# Patient Record
Sex: Male | Born: 1955 | Race: White | Hispanic: No | Marital: Single | Smoking: Former smoker
Health system: Southern US, Community
[De-identification: ages and names within clinical notes are randomized; demographics above are authoritative.]

## PROBLEM LIST (undated history)

## (undated) DIAGNOSIS — T7840XA Allergy, unspecified, initial encounter: Secondary | ICD-10-CM

## (undated) DIAGNOSIS — K219 Gastro-esophageal reflux disease without esophagitis: Secondary | ICD-10-CM

## (undated) DIAGNOSIS — S83419A Sprain of medial collateral ligament of unspecified knee, initial encounter: Secondary | ICD-10-CM

## (undated) DIAGNOSIS — K5730 Diverticulosis of large intestine without perforation or abscess without bleeding: Secondary | ICD-10-CM

## (undated) DIAGNOSIS — M1990 Unspecified osteoarthritis, unspecified site: Secondary | ICD-10-CM

## (undated) DIAGNOSIS — N419 Inflammatory disease of prostate, unspecified: Secondary | ICD-10-CM

## (undated) DIAGNOSIS — C4490 Unspecified malignant neoplasm of skin, unspecified: Secondary | ICD-10-CM

## (undated) DIAGNOSIS — I2510 Atherosclerotic heart disease of native coronary artery without angina pectoris: Secondary | ICD-10-CM

## (undated) DIAGNOSIS — S73199A Other sprain of unspecified hip, initial encounter: Secondary | ICD-10-CM

## (undated) DIAGNOSIS — E785 Hyperlipidemia, unspecified: Secondary | ICD-10-CM

## (undated) HISTORY — DX: Allergy, unspecified, initial encounter: T78.40XA

## (undated) HISTORY — PX: UPPER GASTROINTESTINAL ENDOSCOPY: SHX188

## (undated) HISTORY — DX: Unspecified malignant neoplasm of skin, unspecified: C44.90

## (undated) HISTORY — DX: Diverticulosis of large intestine without perforation or abscess without bleeding: K57.30

## (undated) HISTORY — DX: Hyperlipidemia, unspecified: E78.5

## (undated) HISTORY — DX: Unspecified osteoarthritis, unspecified site: M19.90

## (undated) HISTORY — PX: COLONOSCOPY: SHX174

## (undated) HISTORY — PX: KNEE ARTHROSCOPY WITH ANTERIOR CRUCIATE LIGAMENT (ACL) REPAIR: SHX5644

## (undated) HISTORY — DX: Inflammatory disease of prostate, unspecified: N41.9

## (undated) HISTORY — DX: Sprain of medial collateral ligament of unspecified knee, initial encounter: S83.419A

## (undated) HISTORY — DX: Other sprain of unspecified hip, initial encounter: S73.199A

## (undated) HISTORY — DX: Gastro-esophageal reflux disease without esophagitis: K21.9

---

## 2004-02-19 ENCOUNTER — Ambulatory Visit: Payer: Self-pay | Admitting: Gastroenterology

## 2005-03-15 ENCOUNTER — Ambulatory Visit: Payer: Self-pay | Admitting: Internal Medicine

## 2006-01-27 ENCOUNTER — Ambulatory Visit: Payer: Self-pay | Admitting: Internal Medicine

## 2006-02-21 ENCOUNTER — Ambulatory Visit: Payer: Self-pay | Admitting: Internal Medicine

## 2006-05-16 ENCOUNTER — Ambulatory Visit: Payer: Self-pay | Admitting: Internal Medicine

## 2006-05-16 LAB — CONVERTED CEMR LAB
ALT: 44 units/L — ABNORMAL HIGH (ref 0–40)
AST: 33 units/L (ref 0–37)
Cholesterol: 98 mg/dL (ref 0–200)
HDL: 47.8 mg/dL (ref >39.0)
LDL Cholesterol: 39 mg/dL (ref 0–99)
Total CHOL/HDL Ratio: 2.1
Triglycerides: 56 mg/dL (ref 0–149)
VLDL: 11 mg/dL (ref 0–40)

## 2006-08-24 ENCOUNTER — Ambulatory Visit: Payer: Self-pay | Admitting: Internal Medicine

## 2006-08-24 DIAGNOSIS — J309 Allergic rhinitis, unspecified: Secondary | ICD-10-CM | POA: Insufficient documentation

## 2006-08-24 LAB — CONVERTED CEMR LAB
ALT: 35 units/L (ref 0–40)
AST: 29 units/L (ref 0–37)
Cholesterol: 104 mg/dL (ref 0–200)
HDL: 42 mg/dL (ref >39.0)
LDL Cholesterol: 49 mg/dL (ref 0–99)
Total CHOL/HDL Ratio: 2.5
Triglycerides: 66 mg/dL (ref 0–149)
VLDL: 13 mg/dL (ref 0–40)

## 2006-08-28 ENCOUNTER — Ambulatory Visit: Payer: Self-pay | Admitting: Internal Medicine

## 2007-02-05 ENCOUNTER — Ambulatory Visit: Payer: Self-pay | Admitting: Internal Medicine

## 2007-02-05 DIAGNOSIS — Z85828 Personal history of other malignant neoplasm of skin: Secondary | ICD-10-CM | POA: Insufficient documentation

## 2007-02-05 DIAGNOSIS — E782 Mixed hyperlipidemia: Secondary | ICD-10-CM | POA: Insufficient documentation

## 2007-02-05 LAB — CONVERTED CEMR LAB
Cholesterol, target level: 200 mg/dL
HDL goal, serum: 40 mg/dL
LDL Goal: 160 mg/dL

## 2007-02-12 ENCOUNTER — Ambulatory Visit: Payer: Self-pay | Admitting: Internal Medicine

## 2007-02-13 ENCOUNTER — Encounter (INDEPENDENT_AMBULATORY_CARE_PROVIDER_SITE_OTHER): Payer: Self-pay | Admitting: *Deleted

## 2007-02-15 ENCOUNTER — Encounter (INDEPENDENT_AMBULATORY_CARE_PROVIDER_SITE_OTHER): Payer: Self-pay | Admitting: *Deleted

## 2007-02-15 LAB — CONVERTED CEMR LAB
ALT: 24 units/L (ref 0–53)
AST: 33 units/L (ref 0–37)
Albumin: 4.2 g/dL (ref 3.5–5.2)
Alkaline Phosphatase: 63 units/L (ref 39–117)
BUN: 12 mg/dL (ref 6–23)
Basophils Absolute: 0 10*3/uL (ref 0.0–0.1)
Basophils Relative: 0.5 % (ref 0.0–1.0)
Bilirubin, Direct: 0.1 mg/dL (ref 0.0–0.3)
CO2: 27 meq/L (ref 19–32)
Calcium: 9.6 mg/dL (ref 8.4–10.5)
Chloride: 102 meq/L (ref 96–112)
Cholesterol: 197 mg/dL (ref 0–200)
Creatinine, Ser: 1 mg/dL (ref 0.4–1.5)
Eosinophils Absolute: 0.1 10*3/uL (ref 0.0–0.6)
Eosinophils Relative: 1.8 % (ref 0.0–5.0)
GFR calc Af Amer: 102 mL/min
GFR calc non Af Amer: 84 mL/min
Glucose, Bld: 96 mg/dL (ref 70–99)
HCT: 45.2 % (ref 39.0–52.0)
HDL: 46.7 mg/dL (ref >39.0)
Hemoglobin: 15.9 g/dL (ref 13.0–17.0)
LDL Cholesterol: 122 mg/dL — ABNORMAL HIGH (ref 0–99)
Lymphocytes Relative: 28.9 % (ref 12.0–46.0)
MCHC: 35.1 g/dL (ref 30.0–36.0)
MCV: 93.9 fL (ref 78.0–100.0)
Monocytes Absolute: 0.5 10*3/uL (ref 0.2–0.7)
Monocytes Relative: 8 % (ref 3.0–11.0)
Neutro Abs: 3.6 10*3/uL (ref 1.4–7.7)
Neutrophils Relative %: 60.8 % (ref 43.0–77.0)
Platelets: 256 10*3/uL (ref 150–400)
Potassium: 4.3 meq/L (ref 3.5–5.1)
RBC: 4.82 M/uL (ref 4.22–5.81)
RDW: 11.5 % (ref 11.5–14.6)
Sodium: 138 meq/L (ref 135–145)
TSH: 1.92 microintl units/mL (ref 0.35–5.50)
Total Bilirubin: 1 mg/dL (ref 0.3–1.2)
Total CHOL/HDL Ratio: 4.2
Total Protein: 7.4 g/dL (ref 6.0–8.3)
Triglycerides: 143 mg/dL (ref 0–149)
VLDL: 29 mg/dL (ref 0–40)
WBC: 5.9 10*3/uL (ref 4.5–10.5)

## 2007-09-27 ENCOUNTER — Ambulatory Visit: Payer: Self-pay | Admitting: Internal Medicine

## 2007-09-27 LAB — CONVERTED CEMR LAB
Cholesterol, target level: 200 mg/dL
HDL goal, serum: 40 mg/dL
LDL Goal: 120 mg/dL

## 2007-10-02 ENCOUNTER — Ambulatory Visit: Payer: Self-pay | Admitting: Internal Medicine

## 2007-10-12 ENCOUNTER — Encounter (INDEPENDENT_AMBULATORY_CARE_PROVIDER_SITE_OTHER): Payer: Self-pay | Admitting: *Deleted

## 2007-10-17 ENCOUNTER — Ambulatory Visit: Payer: Self-pay | Admitting: Internal Medicine

## 2007-10-17 LAB — CONVERTED CEMR LAB
Cholesterol, target level: 200 mg/dL
HDL goal, serum: 40 mg/dL
LDL Goal: 90 mg/dL

## 2008-02-26 ENCOUNTER — Ambulatory Visit: Payer: Self-pay | Admitting: Internal Medicine

## 2008-02-26 DIAGNOSIS — K5730 Diverticulosis of large intestine without perforation or abscess without bleeding: Secondary | ICD-10-CM | POA: Insufficient documentation

## 2008-02-26 LAB — CONVERTED CEMR LAB
ALT: 49 units/L (ref 0–53)
AST: 34 units/L (ref 0–37)
Albumin: 4.2 g/dL (ref 3.5–5.2)
Alkaline Phosphatase: 52 units/L (ref 39–117)
BUN: 19 mg/dL (ref 6–23)
Basophils Absolute: 0 10*3/uL (ref 0.0–0.1)
Basophils Relative: 1 % (ref 0.0–3.0)
Bilirubin Urine: NEGATIVE
Bilirubin, Direct: 0.1 mg/dL (ref 0.0–0.3)
CO2: 29 meq/L (ref 19–32)
Calcium: 9.6 mg/dL (ref 8.4–10.5)
Chloride: 105 meq/L (ref 96–112)
Cholesterol, target level: 200 mg/dL
Cholesterol: 139 mg/dL (ref 0–200)
Creatinine, Ser: 0.9 mg/dL (ref 0.4–1.5)
Eosinophils Absolute: 0.1 10*3/uL (ref 0.0–0.7)
Eosinophils Relative: 1.4 % (ref 0.0–5.0)
GFR calc Af Amer: 115 mL/min
GFR calc non Af Amer: 95 mL/min
Glucose, Bld: 95 mg/dL (ref 70–99)
HCT: 42.9 % (ref 39.0–52.0)
HDL goal, serum: 40 mg/dL
HDL: 50.9 mg/dL (ref >39.0)
Hemoglobin: 15 g/dL (ref 13.0–17.0)
Ketones, ur: NEGATIVE mg/dL
LDL Cholesterol: 78 mg/dL (ref 0–99)
LDL Goal: 120 mg/dL
Leukocytes, UA: NEGATIVE
Lymphocytes Relative: 29 % (ref 12.0–46.0)
MCHC: 35 g/dL (ref 30.0–36.0)
MCV: 93.3 fL (ref 78.0–100.0)
Monocytes Absolute: 0.5 10*3/uL (ref 0.1–1.0)
Monocytes Relative: 9.4 % (ref 3.0–12.0)
Neutro Abs: 2.9 10*3/uL (ref 1.4–7.7)
Neutrophils Relative %: 59.2 % (ref 43.0–77.0)
Nitrite: NEGATIVE
PSA: 0.64 ng/mL (ref 0.10–4.00)
Platelets: 164 10*3/uL (ref 150–400)
Potassium: 4.6 meq/L (ref 3.5–5.1)
RBC: 4.6 M/uL (ref 4.22–5.81)
RDW: 11.3 % — ABNORMAL LOW (ref 11.5–14.6)
Sodium: 141 meq/L (ref 135–145)
Specific Gravity, Urine: 1.025 (ref 1.000–1.03)
TSH: 1.69 microintl units/mL (ref 0.35–5.50)
Total Bilirubin: 0.9 mg/dL (ref 0.3–1.2)
Total CHOL/HDL Ratio: 2.7
Total Protein, Urine: NEGATIVE mg/dL
Total Protein: 7.2 g/dL (ref 6.0–8.3)
Triglycerides: 50 mg/dL (ref 0–149)
Urine Glucose: NEGATIVE mg/dL
Urobilinogen, UA: 0.2 (ref 0.0–1.0)
VLDL: 10 mg/dL (ref 0–40)
WBC: 4.9 10*3/uL (ref 4.5–10.5)
pH: 5.5 (ref 5.0–8.0)

## 2008-03-28 ENCOUNTER — Encounter: Payer: Self-pay | Admitting: Internal Medicine

## 2008-04-21 ENCOUNTER — Ambulatory Visit: Payer: Self-pay | Admitting: Family Medicine

## 2008-05-02 ENCOUNTER — Telehealth (INDEPENDENT_AMBULATORY_CARE_PROVIDER_SITE_OTHER): Payer: Self-pay | Admitting: *Deleted

## 2008-07-10 ENCOUNTER — Ambulatory Visit: Payer: Self-pay | Admitting: Family Medicine

## 2009-03-02 ENCOUNTER — Ambulatory Visit: Payer: Self-pay | Admitting: Internal Medicine

## 2009-03-02 LAB — CONVERTED CEMR LAB
AST: 33 units/L (ref 0–37)
Alkaline Phosphatase: 53 units/L (ref 39–117)
Basophils Absolute: 0 10*3/uL (ref 0.0–0.1)
Basophils Relative: 0.4 % (ref 0.0–3.0)
Bilirubin Urine: NEGATIVE
Bilirubin, Direct: 0.1 mg/dL (ref 0.0–0.3)
CO2: 27 meq/L (ref 19–32)
Calcium: 9 mg/dL (ref 8.4–10.5)
Chloride: 105 meq/L (ref 96–112)
Creatinine, Ser: 0.8 mg/dL (ref 0.4–1.5)
Eosinophils Absolute: 0.1 10*3/uL (ref 0.0–0.7)
Glucose, Bld: 99 mg/dL (ref 70–99)
Hemoglobin, Urine: NEGATIVE
Ketones, ur: NEGATIVE mg/dL
LDL Cholesterol: 98 mg/dL (ref 0–99)
LDL Goal: 90 mg/dL
Lymphocytes Relative: 30.5 % (ref 12.0–46.0)
MCHC: 34.3 g/dL (ref 30.0–36.0)
MCV: 95.9 fL (ref 78.0–100.0)
Monocytes Absolute: 0.4 10*3/uL (ref 0.1–1.0)
Neutro Abs: 3.1 10*3/uL (ref 1.4–7.7)
Neutrophils Relative %: 58.5 % (ref 43.0–77.0)
RBC: 4.64 M/uL (ref 4.22–5.81)
RDW: 11.2 % — ABNORMAL LOW (ref 11.5–14.6)
Total Bilirubin: 1.1 mg/dL (ref 0.3–1.2)
Total CHOL/HDL Ratio: 3
Total Protein, Urine: NEGATIVE mg/dL
pH: 5 (ref 5.0–8.0)

## 2009-03-09 ENCOUNTER — Ambulatory Visit: Payer: Self-pay | Admitting: Internal Medicine

## 2009-03-09 LAB — CONVERTED CEMR LAB: OCCULT 3: NEGATIVE

## 2009-03-10 ENCOUNTER — Encounter (INDEPENDENT_AMBULATORY_CARE_PROVIDER_SITE_OTHER): Payer: Self-pay | Admitting: *Deleted

## 2009-06-22 ENCOUNTER — Ambulatory Visit: Payer: Self-pay | Admitting: Internal Medicine

## 2009-06-22 DIAGNOSIS — R209 Unspecified disturbances of skin sensation: Secondary | ICD-10-CM | POA: Insufficient documentation

## 2010-01-13 ENCOUNTER — Telehealth (INDEPENDENT_AMBULATORY_CARE_PROVIDER_SITE_OTHER): Payer: Self-pay | Admitting: *Deleted

## 2010-03-08 ENCOUNTER — Ambulatory Visit: Payer: Self-pay | Admitting: Internal Medicine

## 2010-03-16 LAB — CONVERTED CEMR LAB
ALT: 29 units/L (ref 0–53)
AST: 55 units/L — ABNORMAL HIGH (ref 0–37)
Alkaline Phosphatase: 55 units/L (ref 39–117)
BUN: 20 mg/dL (ref 6–23)
Basophils Absolute: 0 10*3/uL (ref 0.0–0.1)
Bilirubin Urine: NEGATIVE
Bilirubin, Direct: 0.1 mg/dL (ref 0.0–0.3)
CO2: 27 meq/L (ref 19–32)
Calcium: 9.2 mg/dL (ref 8.4–10.5)
Creatinine, Ser: 1 mg/dL (ref 0.4–1.5)
Eosinophils Absolute: 0.2 10*3/uL (ref 0.0–0.7)
GFR calc non Af Amer: 83.14 mL/min (ref >60)
Glucose, Bld: 98 mg/dL (ref 70–99)
Hemoglobin: 14.3 g/dL (ref 13.0–17.0)
Lymphocytes Relative: 35.1 % (ref 12.0–46.0)
MCHC: 35.3 g/dL (ref 30.0–36.0)
Neutro Abs: 2.4 10*3/uL (ref 1.4–7.7)
Neutrophils Relative %: 52.3 % (ref 43.0–77.0)
Nitrite: NEGATIVE
Platelets: 196 10*3/uL (ref 150.0–400.0)
RDW: 12.5 % (ref 11.5–14.6)
TSH: 1.38 microintl units/mL (ref 0.35–5.50)
Total Bilirubin: 0.8 mg/dL (ref 0.3–1.2)
Total Protein, Urine: NEGATIVE mg/dL
Urobilinogen, UA: 0.2 (ref 0.0–1.0)

## 2010-03-17 ENCOUNTER — Encounter: Payer: Self-pay | Admitting: Internal Medicine

## 2010-03-17 ENCOUNTER — Ambulatory Visit: Payer: Self-pay | Admitting: Internal Medicine

## 2010-03-17 DIAGNOSIS — R7401 Elevation of levels of liver transaminase levels: Secondary | ICD-10-CM | POA: Insufficient documentation

## 2010-03-17 DIAGNOSIS — R740 Nonspecific elevation of levels of transaminase and lactic acid dehydrogenase [LDH]: Secondary | ICD-10-CM

## 2010-03-18 LAB — CONVERTED CEMR LAB
HDL: 56.1 mg/dL (ref >39.00)
Total CHOL/HDL Ratio: 3
VLDL: 18.2 mg/dL (ref 0.0–40.0)

## 2010-04-27 ENCOUNTER — Ambulatory Visit
Admission: RE | Admit: 2010-04-27 | Discharge: 2010-04-27 | Payer: Self-pay | Source: Home / Self Care | Attending: Internal Medicine | Admitting: Internal Medicine

## 2010-04-27 ENCOUNTER — Other Ambulatory Visit: Payer: Self-pay | Admitting: Internal Medicine

## 2010-04-27 LAB — AST: AST: 25 U/L (ref 0–37)

## 2010-04-27 LAB — ALT: ALT: 30 U/L (ref 0–53)

## 2010-05-20 NOTE — Assessment & Plan Note (Signed)
Summary: cpx /cbs   Vital Signs:  Patient profile:   55 year old male Height:      70.5 inches Weight:      189.8 pounds BMI:     26.95 Temp:     98.5 degrees F oral Pulse rate:   60 / minute Resp:     14 per minute BP sitting:   112 / 70  (left arm) Cuff size:   large  Vitals Entered By: Shonna Chock CMA (March 17, 2010 10:56 AM) CC: CPX and discuss labs , Lipid Management Comments Patient was given info about Shingles Vaccine    CC:  CPX and discuss labs  and Lipid Management.  History of Present Illness:      Jason Vaughn is here for a  physical; he is asymptomatic except for  RLE  IT Band   issues  being treated by Dr Victorino Dike with Physical Therapy.      Hyperlipidemia Follow-Up:   The patient denies muscle aches, GI upset, abdominal pain, flushing, itching, constipation, diarrhea, and fatigue.  The patient denies the following symptoms: chest pain/pressure, exercise intolerance, dypsnea, palpitations, syncope, and pedal edema.  Compliance with medications (by patient report) has been near 100%.  Dietary compliance has been fair-good.  The patient reports exercising 4-5 X per week as Elliptical.  Adjunctive measures currently used by the patient include fish oil supplements.    Lipid Management History:      Positive NCEP/ATP III risk factors include male age 63 years old or older and family history for ischemic heart disease (males less than 35 years old).  Negative NCEP/ATP III risk factors include non-diabetic, non-tobacco-user status, non-hypertensive, no ASHD (atherosclerotic heart disease), no prior stroke/TIA, no peripheral vascular disease, and no history of aortic aneurysm.     Current Medications (verified): 1)  Multivitamins   Tabs (Multiple Vitamin) .... Take 1 Tablet By Mouth Daily 2)  Fish Oil .... Take 1-2 Daily By Mouthout For Several Months 3)  Crestor 5 Mg  Tabs (Rosuvastatin Calcium) .... Daily As Directed 4)  Flonase 50 Mcg/act Susp (Fluticasone Propionate)  .... 2 Sprays Each Nostril Once Daily 5)  Uroxatral 10 Mg Xr24h-Tab (Alfuzosin Hcl) .... Take One Daily  Allergies (verified): No Known Drug Allergies  Past History:  Past Medical History: Allergic rhinitis Hyperlipidemia : LDL 99(1432/994), TG 109,HDL 49. LDL goal = < 90, ideally < 75. Framingham Study LDL goal = < 130. 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         Slight hearing loss on L; he shot for years w/o protection. Uroxatrol helps hesitancy.  Physical Exam  General:  well-nourished;alert,appropriate and cooperative throughout examination Head:  Normocephalic and atraumatic without obvious abnormalities. Eyes:  No corneal or conjunctival inflammation noted. Punctate pigment OD. Perrla. Funduscopic exam benign, without hemorrhages, exudates or papilledema.  Ears:  L ear normal.  Wax on R. Hearing normal to whisper  @ 6 ft. Nose:  External nasal examination shows no deformity or inflammation. Nasal mucosa are pink and moist without lesions or exudates. Slight septal deviation Mouth:  Oral mucosa and oropharynx  without lesions or exudates.  Teeth in good repair. Neck:  No deformities, masses, or tenderness noted. Lungs:  Normal respiratory effort, chest expands symmetrically. Lungs are clear to auscultation, no crackles or wheezes. Heart:  regular rhythm, no murmur, no gallop, no rub, no JVD, no HJR, and bradycardia.   Abdomen:  Bowel sounds positive,abdomen soft and non-tender without masses, organomegaly or hernias noted. Genitalia:  Dr Vonita Moss performed exam Summer 2011 Msk:  No deformity or scoliosis noted of thoracic or lumbar spine.   Pulses:  R and L carotid,radial,dorsalis pedis and posterior tibial pulses are full and equal bilaterally Extremities:  No clubbing, cyanosis, edema, or deformity noted with normal full range of motion of all joints.   Neg SLR bilaterally Neurologic:  alert & oriented X3, strength normal in all extremities, and DTRs symmetrical and normal.   Skin:  Intact without suspicious lesions or rashes. Biopsy site LUQ healed Cervical Nodes:  No lymphadenopathy noted Axillary Nodes:  No palpable lymphadenopathy Inguinal Nodes:  No significant adenopathy Psych:  memory intact for recent and remote, normally interactive, and good eye contact.     Impression & Recommendations:  Problem # 1:  ROUTINE GENERAL MEDICAL EXAM@HEALTH  CARE FACL (ICD-V70.0)  Orders: EKG w/ Interpretation (93000)  Problem # 2:  HYPERLIPIDEMIA (ICD-272.2)  His updated medication list for this problem includes:    Crestor 5 Mg Tabs (Rosuvastatin calcium) .Marland Kitchen... Daily as directed  Orders: Specimen Handling (52841) TLB-Lipid Panel (80061-LIPID) No Charge Patient Arrived (NCPA0) (NCPA0)  Problem # 3:  FAMILY HISTORY OF ISCHEMIC HEART DISEASE (ICD-V17.3)  Problem # 4:  NONSPEC ELEVATION OF LEVELS OF TRANSAMINASE/LDH (ICD-790.4)  Complete Medication List: 1)  Multivitamins Tabs (Multiple vitamin) .... Take 1 tablet by mouth daily 2)  Fish Oil  .... Take 1-2 daily by mouthout for several  months 3)  Crestor 5 Mg Tabs (Rosuvastatin calcium) .... Daily as directed 4)  Flonase 50 Mcg/act Susp (Fluticasone propionate) .... 2 sprays each nostril once daily 5)  Uroxatral 10 Mg Xr24h-tab (Alfuzosin hcl) .... Take one daily  Other Orders: Admin 1st Vaccine (32440) Flu Vaccine 66yrs + (10272)  Lipid Assessment/Plan:      Based on NCEP/ATP III, the patient's risk factor category is "2 or more risk factors and a calculated 10 year CAD risk of < 20%".  The patient's lipid goals are as follows: Total cholesterol goal is 200; LDL cholesterol goal is 90; HDL cholesterol goal is 40; Triglyceride goal is 150.  His LDL cholesterol goal has been met.    Patient Instructions: 1)  Recheck fasting AST & ALT in 6-8 weeks ( 790.4). 2)  It is important that you exercise regularly at least 20 minutes 5 times a week. If you develop chest pain, have severe difficulty breathing, or feel very tired , stop exercising immediately and seek medical attention. Avoid excess Tylenol, alcohol or vit A as discussed. 3)  Take an  81 mg coated  Aspirin every day.   Orders Added: 1)  Specimen Handling [99000] 2)  TLB-Lipid Panel [80061-LIPID] 3)  No Charge  Patient Arrived (NCPA0) [NCPA0] 4)  Admin 1st Vaccine [90471] 5)  Flu Vaccine 37yrs + [90658] 6)  Est. Patient 40-64 years [99396] 7)  EKG w/ Interpretation [93000] Flu Vaccine Consent Questions     Do you have a history of severe allergic reactions to this vaccine? no    Any prior history of allergic reactions to egg and/or gelatin? no    Do you have a sensitivity to the preservative Thimersol? no    Do you have a past history of Guillan-Barre Syndrome? no    Do you currently have an acute febrile illness? no    Have you ever had a severe reaction to latex? no    Vaccine information given and explained to patient? yes    Are you currently pregnant? no    Lot Number:[id number]   Exp Date:10/16/2010   Site Given  Right  Deltoid IMmin 1st Vaccine [90471] 5)   Flu Vaccine 56yrs + [16109]    .lbflu

## 2010-05-20 NOTE — Assessment & Plan Note (Signed)
Summary: NUMBNESS LEFT THIGH/RH......   Vital Signs:  Patient profile:   55 year old male Weight:      196.2 pounds Temp:     98.2 degrees F oral Pulse rate:   60 / minute Resp:     14 per minute BP sitting:   116 / 68  (left arm) Cuff size:   large  Vitals Entered By: Shonna Chock (June 22, 2009 1:24 PM) CC: Numbness in left thigh x 1 month Comments REVIEWED MED LIST, PATIENT AGREED DOSE AND INSTRUCTION CORRECT    CC:  Numbness in left thigh x 1 month.  History of Present Illness: Onset as tingling in L thigh whie supine 4 weeks ago; he noted numbness  when he rubbed.It has persisted; occasionally he has "shock" in same  area.  PMH of intermittent LBP w/o injury.He was struck by baseball in same area with marked ecchymosis. Numbness has no limitation in reference to execise or activities  Allergies (verified): No Known Drug Allergies  Review of Systems General:  Denies chills, fever, sweats, and weight loss. GU:  Denies discharge, dysuria, hematuria, and incontinence. MS:  Complains of joint pain and low back pain; denies joint redness, joint swelling, mid back pain, and thoracic pain; R shoulder pain & occasional knee pressure  post exercise. Derm:  Denies lesion(s) and rash. Neuro:  Denies brief paralysis, disturbances in coordination, poor balance, and weakness; No groin paresthesias.  Physical Exam  General:  well-nourished,in no acute distress; alert,appropriate and cooperative throughout examination Abdomen:  Bowel sounds positive,abdomen soft and non-tender  Extremities:  No clubbing, cyanosis, edema. Neg SLR bilaterally Neurologic:  alert & oriented X3, strength normal in all extremities, sensation decreased  to pinprick L thigh laterally , gait(heel , toe) normal, and DTRs symmetrical and normal.   Skin:  Intact without suspicious lesions or rashes   Impression & Recommendations:  Problem # 1:  DISTURBANCE OF SKIN SENSATION (ICD-782.0) Numbness in L4  distribution vs local nerve damage from Baseball injury 2009  Complete Medication List: 1)  Multivitamins Tabs (Multiple vitamin) .... Take 1 tablet by mouth daily 2)  Fish Oil  .... Take 1-2 daily by mouthout for several months 3)  Crestor 5 Mg Tabs (Rosuvastatin calcium) .... Daily as directed 4)  Flonase 50 Mcg/act Susp (Fluticasone propionate) .... 2 sprays each nostril once daily 5)  Uroxatral 10 Mg Xr24h-tab (Alfuzosin hcl) .... Take one daily  Patient Instructions: 1)  LS spine films if impact on function or strength or positionality component suggested.

## 2010-05-20 NOTE — Progress Notes (Signed)
Summary: lab order   Phone Note Call from Patient   Summary of Call: patient has cpx scheduled 113011 - lab at [facility] [id number] - need lab order  .Marland KitchenOkey Regal Spring  January 13, 2010 10:27 AM   Follow-up for Phone Call        TLB-Lipid Panel [80061-LIPID] TLB-Hepatic/Liver Function Pnl [80076-HEPATIC] TLB-BMP (Basic Metabolic Panel-BMET) [80048-METABOL] TLB-CBC Platelet - w/Differential [85025-CBCD] TLB-TSH (Thyroid Stimulating Hormone) [84443-TSH] TLB-PSA (Prostate Specific Antigen) [84153-PSA] TLB-Udip ONLY [81003-UDIP]  272.2/995.20/ v70.0 Follow-up by: Shonna Chock CMA,  January 13, 2010 1:53 PM  Additional Follow-up for Phone Call Additional follow up Details #1::        lab order added to appt .Marland KitchenOkey Regal Spring  January 14, 2010 9:05 AM

## 2010-06-07 ENCOUNTER — Telehealth (INDEPENDENT_AMBULATORY_CARE_PROVIDER_SITE_OTHER): Payer: Self-pay | Admitting: *Deleted

## 2010-06-15 NOTE — Progress Notes (Signed)
Summary: Refill  Phone Note Call from Patient Call back at Home Phone [phone] Message from:  Patient on June 07, 2010 2:47 PM  Caller: Patient Summary of Call: pt is calling for a refill on his Ventolin. Pt states that the rx was written several years ago. Pt can be contacted at [phone] [facility] Initial call taken by: Lavell Islam,  June 07, 2010 2:47 PM    New/Updated Medications: VENTOLIN HFA 108 (90 BASE) MCG/ACT AERS (ALBUTEROL SULFATE) 2 puffs pre exercise & every 4 hrs as needed SOB Prescriptions: VENTOLIN HFA 108 (90 BASE) MCG/ACT AERS (ALBUTEROL SULFATE) 2 puffs pre exercise & every 4 hrs as needed SOB  #1 x 2   Entered by:   Shonna Chock CMA   Authorized by:   Marga Melnick MD   Signed by:   Shonna Chock CMA on 06/07/2010   Method used:   Electronically to        [facility]. [phone]* (retail)       [address]       [place], [place]  [address]       Ph: [phone]       Fax: [phone]   RxID:   [id number]

## 2011-03-18 ENCOUNTER — Encounter: Payer: Self-pay | Admitting: Internal Medicine

## 2011-03-22 ENCOUNTER — Ambulatory Visit (INDEPENDENT_AMBULATORY_CARE_PROVIDER_SITE_OTHER): Payer: BC Managed Care – PPO | Admitting: Internal Medicine

## 2011-03-22 ENCOUNTER — Encounter: Payer: Self-pay | Admitting: Internal Medicine

## 2011-03-22 VITALS — BP 114/76 | HR 52 | Temp 98.2°F | Resp 14 | Ht 70.5 in | Wt 184.0 lb

## 2011-03-22 DIAGNOSIS — Z0000 Encounter for general adult medical examination without abnormal findings: Secondary | ICD-10-CM

## 2011-03-22 DIAGNOSIS — E782 Mixed hyperlipidemia: Secondary | ICD-10-CM

## 2011-03-22 DIAGNOSIS — M545 Low back pain, unspecified: Secondary | ICD-10-CM

## 2011-03-22 DIAGNOSIS — Z23 Encounter for immunization: Secondary | ICD-10-CM

## 2011-03-22 DIAGNOSIS — G8929 Other chronic pain: Secondary | ICD-10-CM

## 2011-03-22 LAB — HEPATIC FUNCTION PANEL
ALT: 41 U/L (ref 0–53)
Total Protein: 7.2 g/dL (ref 6.0–8.3)

## 2011-03-22 LAB — LIPID PANEL
Cholesterol: 168 mg/dL (ref 0–200)
HDL: 66.5 mg/dL (ref >39.00)
LDL Cholesterol: 91 mg/dL (ref 0–99)
Triglycerides: 54 mg/dL (ref 0.0–149.0)
VLDL: 10.8 mg/dL (ref 0.0–40.0)

## 2011-03-22 LAB — CBC WITH DIFFERENTIAL/PLATELET
Eosinophils Relative: 1.2 % (ref 0.0–5.0)
HCT: 42.9 % (ref 39.0–52.0)
Lymphocytes Relative: 33.3 % (ref 12.0–46.0)
Monocytes Relative: 7.6 % (ref 3.0–12.0)
Neutrophils Relative %: 57.4 % (ref 43.0–77.0)
Platelets: 221 10*3/uL (ref 150.0–400.0)
WBC: 5.3 10*3/uL (ref 4.5–10.5)

## 2011-03-22 LAB — BASIC METABOLIC PANEL
BUN: 15 mg/dL (ref 6–23)
Creatinine, Ser: 0.9 mg/dL (ref 0.4–1.5)
GFR: 90.05 mL/min (ref >60.00)
Potassium: 3.8 mEq/L (ref 3.5–5.1)

## 2011-03-22 NOTE — Progress Notes (Signed)
Subjective:    Patient ID: Jason Vaughn, male    DOB: 1955/10/17, 55 y.o.   MRN: [id number]  HPI  Jason Vaughn is here for a physical;acute issues include LBP      Review of Systems BACK PAIN: Location: R LS area  Quality: throbbing  Onset: 7/12 Worse with: none  Better with:NSAIDS Radiation: no Trauma/ trigger: no Best sitting/standing/leaning forward: no Red Flags Fecal/urinary incontinence: no  Numbness/Weakness: no  Fever/chills/sweats: no  Night pain: no  Unexplained weight loss: no  PMH of osteoporosis or chronic steroid use: no  Evaluation: MRI 7/12 [facility]; he was given the option of local steroid injections if the symptoms persisted or progressed. He  elected not to pursue this.       Objective:   Physical Exam Gen.: Thin but healthy and well-nourished in appearance. Alert, appropriate and cooperative throughout exam. Head: Normocephalic without obvious abnormalities;  Pattern  alopecia  Eyes: No corneal or conjunctival inflammation noted. Pupils equal round reactive to light and accommodation. Fundal exam is benign without hemorrhages, exudate, papilledema. Extraocular motion intact. Vision grossly normal. Ears: External  ear exam reveals no significant lesions or deformities. Canals clear .TMs normal. Hearing is grossly normal bilaterally. Nose: External nasal exam reveals no deformity or inflammation. Nasal mucosa are pink and moist. No lesions or exudates noted. Septum w/o deviation  Mouth: Oral mucosa and oropharynx reveal no lesions or exudates. Teeth in good repair. Neck: No deformities, masses, or tenderness noted. Range of motion & Thyroid normal. Lungs: Normal respiratory effort; chest expands symmetrically. Lungs are clear to auscultation without rales, wheezes, or increased work of breathing. Heart: Slow regular   rhythm. Normal S1 and S2. No gallop, click, or rub. No murmur. Abdomen: Bowel sounds normal; abdomen soft and nontender. No masses, organomegaly  or hernias noted. Genitalia/ DRE: Dr Vonita Moss, Urologist   .                                                                                   Musculoskeletal/extremities: No deformity or scoliosis noted of  the thoracic or lumbar spine. No clubbing, cyanosis, edema, or deformity noted. Range of motion  normal .Tone & strength  normal.Joints normal. Nail health  good. He is able to lie flat and sit up without help. Straight leg raising is normal bilaterally to 90. Vascular: Carotid, radial artery, dorsalis pedis and  posterior tibial pulses are full and equal. No bruits present. Neurologic: Alert and oriented x3. Deep tendon reflexes symmetrical and normal . Gait and heel/toe walking are normal.         Skin: Intact without suspicious lesions or rashes. Lymph: No cervical, axillary, or inguinal lymphadenopathy present. Psych: Mood and affect are normal. Normally interactive  Assessment & Plan:  #1 comprehensive physical exam; no acute findings #2 see Problem List with Assessments & Recommendations  #3 right lumbosacral area pain  ; no neuromuscular deficit present. Stretching exercises would be recommended.  Plan: see Orders

## 2011-03-22 NOTE — Patient Instructions (Signed)
Preventive Health Care: Exercise at least 30-45 minutes a day,  3-4 days a week.  Eat a low-fat diet with lots of fruits and vegetables, up to 7-9 servings per day.Consume less than 40 grams of sugar per day from foods & drinks with High Fructose Corn Sugar as # 1,2,3 or # 4 on label. The best exercises for the low back include freestyle swimming, stretch aerobics, and yoga.

## 2011-06-27 ENCOUNTER — Telehealth: Payer: Self-pay | Admitting: Internal Medicine

## 2011-06-27 DIAGNOSIS — R7989 Other specified abnormal findings of blood chemistry: Secondary | ICD-10-CM

## 2011-06-27 NOTE — Telephone Encounter (Signed)
Patient called & stated he wanted to go to [facility] for his 3-4 month follow up labs from the 03/22/2011 visit. Can you please put those orders in so he can go tomorrow morning?  Dennie Bible ph# [phone]

## 2011-06-28 ENCOUNTER — Other Ambulatory Visit: Payer: BC Managed Care – PPO

## 2011-06-28 ENCOUNTER — Other Ambulatory Visit (INDEPENDENT_AMBULATORY_CARE_PROVIDER_SITE_OTHER): Payer: BC Managed Care – PPO

## 2011-06-28 DIAGNOSIS — R7989 Other specified abnormal findings of blood chemistry: Secondary | ICD-10-CM

## 2011-06-28 LAB — HEPATIC FUNCTION PANEL
Bilirubin, Direct: 0.1 mg/dL (ref 0.0–0.3)
Total Bilirubin: 0.6 mg/dL (ref 0.3–1.2)

## 2011-06-28 NOTE — Telephone Encounter (Signed)
[facility] office called, patient in office now. Future order placed for LFT's per lab notes from 03/2011

## 2011-07-10 ENCOUNTER — Encounter (HOSPITAL_COMMUNITY): Payer: Self-pay | Admitting: Emergency Medicine

## 2011-07-10 ENCOUNTER — Emergency Department (HOSPITAL_COMMUNITY)
Admission: EM | Admit: 2011-07-10 | Discharge: 2011-07-10 | Disposition: A | Discharge status: Home / Self Care | Payer: BC Managed Care – PPO | Attending: Emergency Medicine | Admitting: Emergency Medicine

## 2011-07-10 DIAGNOSIS — R51 Headache: Secondary | ICD-10-CM | POA: Insufficient documentation

## 2011-07-10 DIAGNOSIS — Z203 Contact with and (suspected) exposure to rabies: Secondary | ICD-10-CM | POA: Insufficient documentation

## 2011-07-10 DIAGNOSIS — Z87891 Personal history of nicotine dependence: Secondary | ICD-10-CM | POA: Insufficient documentation

## 2011-07-10 DIAGNOSIS — Z7982 Long term (current) use of aspirin: Secondary | ICD-10-CM | POA: Insufficient documentation

## 2011-07-10 DIAGNOSIS — Z85828 Personal history of other malignant neoplasm of skin: Secondary | ICD-10-CM | POA: Insufficient documentation

## 2011-07-10 DIAGNOSIS — E785 Hyperlipidemia, unspecified: Secondary | ICD-10-CM | POA: Insufficient documentation

## 2011-07-10 MED ORDER — RABIES IMMUNE GLOBULIN 150 UNIT/ML IM INJ
20.0000 [IU]/kg | INJECTION | Freq: Once | INTRAMUSCULAR | Status: AC
  Administered 2011-07-10: 1650 [IU] via INTRAMUSCULAR
  Filled 2011-07-10: qty 11

## 2011-07-10 MED ORDER — RABIES VACCINE, PCEC IM SUSR
1.0000 mL | Freq: Once | INTRAMUSCULAR | Status: AC
  Administered 2011-07-10: 1 mL via INTRAMUSCULAR
  Filled 2011-07-10: qty 1

## 2011-07-10 NOTE — ED Provider Notes (Signed)
History     CSN: [id number]  Arrival date & time 07/10/11  1816   First MD Initiated Contact with Patient 07/10/11 1908      Chief Complaint  Patient presents with  . Rabies Injection    (Consider location/radiation/quality/duration/timing/severity/associated sxs/prior treatment) HPI Patient is a 56 yo male who presents today with concern over rabies exposure.  The patient was moving a racoon trap yesterday and noted when he came inside afterwards that he had some blood on his right hand.  He had not noted it previously and was uncertain if it had had any contact with his eyes, nose, or mouth.  He did feel that this was likely from the [place].  Patient had a few mild headaches and was concerned that this might constitute a rabies exposure.  He is here today as he would like to receive post-exposure prophylaxis. Past Medical History  Diagnosis Date  . Hyperlipidemia   . Allergy     periennial  . Cancer     skin: basal & squamous cell  . Diverticulosis of colon   . Tear of MCL (medial collateral ligament) of knee     ; [facility]  . Acetabular labrum tear     right; [facility]    Past Surgical History  Procedure Date  . Colonoscopy 2005    diverticulosis; Dr Arlyce Dice    Family History  Problem Relation Age of Onset  . Coronary artery disease Mother     stent @ 64  . Cancer Father     lung cancer  . Eosinophilic granuloma Father   . Hypertension Brother   . Heart disease  45    MI; MGuncle  . Cancer Maternal Grandmother     BREAST  . Coronary artery disease Maternal Grandmother   . Heart disease Maternal Grandfather 26    MI  . Cancer Paternal Grandmother     LYMPHOMA  . Mental illness Brother     bipolar disorder comittied suicide    History  Substance Use Topics  . Smoking status: Former Smoker    Quit date: 04/18/1980  . Smokeless tobacco: Not on file  . Alcohol Use: Yes     very rarely      Review of Systems  Neurological: Positive for headaches.  All other  systems reviewed and are negative.    Allergies  Review of patient's allergies indicates no known allergies.  Home Medications   Current Outpatient Rx  Name Route Sig Dispense Refill  . ALBUTEROL SULFATE HFA 108 (90 BASE) MCG/ACT IN AERS Inhalation Inhale 2 puffs into the lungs every 4 (four) hours as needed. For shortness of breath    . ALFUZOSIN HCL ER 10 MG PO TB24 Oral Take 10 mg by mouth daily.      . ASPIRIN EC 81 MG PO TBEC Oral Take 81 mg by mouth daily.    . OMEGA-3 FATTY ACIDS 1000 MG PO CAPS Oral Take 2 g by mouth daily.      . MULTIVITAL PO TABS Oral Take 1 tablet by mouth daily.      Marland Kitchen ROSUVASTATIN CALCIUM 5 MG PO TABS Oral Take 5 mg by mouth 3 (three) times a week. Take mon,wed,and fri.      BP 123/89  Pulse 78  Temp(Src) 98.3 F (36.8 C) (Oral)  Resp 19  Wt 180 lb (81.647 kg)  SpO2 95%  Physical Exam  Nursing note and vitals reviewed. Constitutional: He is oriented to person, place, and time. He  appears well-developed and well-nourished. No distress.  HENT:  Head: Normocephalic and atraumatic.  Eyes: Conjunctivae and EOM are normal. Pupils are equal, round, and reactive to light.  Neck: Normal range of motion.  Cardiovascular: Normal rate.   Pulmonary/Chest: Effort normal.  Musculoskeletal: Normal range of motion.  Neurological: He is alert and oriented to person, place, and time. No cranial nerve deficit. Coordination normal.  Skin: Skin is warm and dry. No rash noted.  Psychiatric: He has a normal mood and affect.    ED Course  Procedures (including critical care time)  Labs Reviewed - No data to display No results found.   1. Need for post exposure prophylaxis for rabies       MDM  Patient was evaluated and hemodynamically stable.  We discussed the risks vs. benefits of post-exposure prophylaxis given exposure.  Patient desired treatment and he received immunoglobulin and vaccine.  He was given schedule for follow-up injections at urgent care  and discharged in good condition.        Cyndra Numbers, MD 07/11/11 1031

## 2011-07-10 NOTE — ED Notes (Signed)
Waiting for rabies vaccine and rabies immune globulin from pharmacy; patient updated on delay.

## 2011-07-10 NOTE — ED Notes (Signed)
Shot schedule for remainder of rabies vaccines completed and faxed to pt's RN in ED , [facility] and to pharmacy x 2.

## 2011-07-10 NOTE — ED Notes (Signed)
Patient requesting rabies injection; states that he was exposed to racoon blood on his right hand yesterday.  Patient denies any open wounds at site of blood exposure; denies direct contact with racoon.  Patient able to move all extremities without difficulty; no injury noted to right hand. Patient alert and oriented x4; PERRL present. Will continue to monitor.

## 2011-07-10 NOTE — Discharge Instructions (Signed)
Rabies   You may have been exposed to rabies. It can be carried by skunks, bats, woodchucks, raccoons and foxes. It is less common in dogs; however this is one of the most common bites for which the rabies vaccine is given. When bitten by an infected animal, a person gets rabies from the infected saliva (spit) of the animal. Most people get sick 20 to 90 days after being bitten. This varies based on the location of the bite. The rabies virus affects the brain so the closer to the head the bite occurs, the less time it will take the illness to develop. Once rabies develops it almost always causes death. Because of this, it is often necessary to start treatment whether it is known if the animal is healthy or not.  If bitten by an animal and the animal can be observed to see if it remains healthy, often no further treatment is necessary other than care of the wounds caused by the animal. If the animal has been killed it can be sent into a state laboratory and the brain can be examined to see if the animal had rabies.  TREATMENT   There is no known treatment for rabies once the disease starts. This is a viral illness and antibiotics (medications which kill germs) do not help. This is why caregivers use extra caution and treat questionable bites with rabies vaccine to prevent the disease.  RABIES IMMUNIZATION SCHEDULE - POST-EXPOSURE  Be sure to record the dates of your injections for your records.  1st Injection Date________________________  Day 3__________________________________  Day 7__________________________________  Day 14_________________________________  HOME CARE INSTRUCTIONS   If you have received a bite from an unknown animal, make sure you know the instructions for follow up. If the animal was sent to a laboratory for examination, make sure you know how you are to obtain your results.   Keep wound clean, dry and dressed as suggested by your caregiver.   Take antibiotics as directed and finish the  prescription, even if the wound appears OK.   Keep injured part elevated as much as possible.   Do not resume use of the affected area until directed.   Only take over-the-counter or prescription medicines for pain, discomfort, or fever as directed by your caregiver.  SEEK IMMEDIATE MEDICAL CARE IF:    You have a fever.   There is nausea (feeling sick to your stomach), vomiting, chills or a high temperature.   There is unusual behavior including hyperactivity, fear of water (hydrophobia), or fear of air (aerophobia).   If pain prevents movement of any joint.   If you are unable to move a finger or toe.   The wound becomes more inflamed and swollen, or has a purulent (pus-like) discharge.   You notice that there is a foreign substance, such as cloth or a tooth, in the wound.   If a red line develops at the site of the wound and begins to move up the arm or leg.  Document Released: 04/04/2005 Document Revised: 03/24/2011 Document Reviewed: 07/10/2008  ExitCare Patient Information 2012 [employer].

## 2011-07-10 NOTE — ED Notes (Signed)
Reviewed d/c instructions and shot schedule for rabies vaccines; pt asked questions re: where [facility] was located and hours of operation- provided print out with address, hours of operation, and phone number for [facility]; pt with no further questions and verbalized understanding; pt ambulatory at discharge

## 2011-07-10 NOTE — ED Notes (Signed)
Pt states he got blood on R hand from a racoon trap yesterday morning.  States he did not touch racoon.  Pt states he is here for rabies injection.

## 2011-07-11 ENCOUNTER — Telehealth: Payer: Self-pay | Admitting: *Deleted

## 2011-07-11 NOTE — Telephone Encounter (Signed)
[facility] Triage Call Report Triage Record Num: [id number] Operator: Jeraldine Loots Patient Name: Jason Vaughn Call Date & Time: 07/10/2011 11:14:02AM Patient Phone: [phone] PCP: Marga Melnick Patient Gender: Male PCP Fax : [phone] Patient DOB: Jul 23, 1955 Practice Name: [facility] Reason for Call: Caller: [name]/Patient; PCP: Marga Melnick; CB#: [phone]; He had trapped a racoon and got some of the blood on his hand. No bites or scratches from the animal, just he blood on his hand. No known open wounds to the hand. He washed his hands immediately with warm, soapy water. From CDC Exposure: Types of exposure-rabies Other contact by itself, such as petting a rabid animal and contact with blood, urine, or feces of a rabid animal, does not constitute an exposure and is not an indication for postexposure vaccination. Pt. does state that his Tetanus immunization is up to date. Protocol(s) Used: Bites - Animal or Human Recommended Outcome per Protocol: Provide Home/Self Care Reason for Outcome: All other situations involving animal / human bites Care Advice: ~ 07/10/2011 11:24:50AM

## 2011-07-11 NOTE — Telephone Encounter (Signed)
Pt seen in ED 

## 2011-07-11 NOTE — Telephone Encounter (Signed)
[facility] Triage Call Report Triage Record Num: [id number] Operator: Remonia Richter Patient Name: Jason Vaughn Call Date & Time: 07/10/2011 5:43:03PM Patient Phone: [phone] PCP: Marga Melnick Patient Gender: Male PCP Fax : [phone] Patient DOB: 12/17/1955 Practice Name: [facility] Reason for Call: Caller: [name]/Patient; PCP: Marga Melnick; CB#: [phone]; Call regarding Racoon blood on hand; he called this am for triage and given home care advise,now calling stating he would be more comfortable being seen today by MD for possible treatment of exposure, he will go to ER at [facility] for eval and request treatment per choice Protocol(s) Used: Bites - Animal or Human Recommended Outcome per Protocol: Provide Home/Self Care Reason for Outcome: All other situations involving animal / human bites Care Advice: ~ No special treatment is needed if skin has not been broken. ~ SYMPTOM / CONDITION MANAGEMENT Gently wash around the area with a mild soap and water. Wash the wound with plain water. Apply firm pressure with a clean cloth to control bleeding. Apply an nonprescription topical antibiotic ointment such as bacitracin, if no known allergies. ~ 07/10/2011 5:50:01PM

## 2011-07-13 ENCOUNTER — Emergency Department (HOSPITAL_COMMUNITY)
Admission: EM | Admit: 2011-07-13 | Discharge: 2011-07-13 | Disposition: A | Discharge status: Home / Self Care | Payer: BC Managed Care – PPO | Source: Home / Self Care

## 2011-07-13 ENCOUNTER — Encounter (HOSPITAL_COMMUNITY): Payer: Self-pay | Admitting: Emergency Medicine

## 2011-07-13 DIAGNOSIS — Z23 Encounter for immunization: Secondary | ICD-10-CM

## 2011-07-13 MED ORDER — RABIES VACCINE, PCEC IM SUSR
INTRAMUSCULAR | Status: AC
  Filled 2011-07-13: qty 1

## 2011-07-13 MED ORDER — RABIES VACCINE, PCEC IM SUSR
1.0000 mL | Freq: Once | INTRAMUSCULAR | Status: AC
  Administered 2011-07-13: 1 mL via INTRAMUSCULAR

## 2011-07-13 NOTE — ED Notes (Signed)
Pt here for rabies injection.denies pain or problems

## 2011-07-13 NOTE — Discharge Instructions (Signed)
PT GIVEN RABIES INJECTION.INFORMED TO RETURN ON 07/17/11 FOR NEXT ONE

## 2011-07-17 ENCOUNTER — Encounter (HOSPITAL_COMMUNITY): Payer: Self-pay

## 2011-07-17 ENCOUNTER — Emergency Department (HOSPITAL_COMMUNITY)
Admission: EM | Admit: 2011-07-17 | Discharge: 2011-07-17 | Disposition: A | Discharge status: Home / Self Care | Payer: BC Managed Care – PPO | Source: Home / Self Care

## 2011-07-17 MED ORDER — RABIES VACCINE, PCEC IM SUSR
INTRAMUSCULAR | Status: AC
  Filled 2011-07-17: qty 1

## 2011-07-17 MED ORDER — RABIES VACCINE, PCEC IM SUSR
1.0000 mL | Freq: Once | INTRAMUSCULAR | Status: AC
  Administered 2011-07-17: 1 mL via INTRAMUSCULAR

## 2011-07-17 NOTE — Discharge Instructions (Signed)
Please return at 9:00am on 07/24/11 for final rabies injection.

## 2011-07-17 NOTE — ED Notes (Signed)
Patient here for 3rd rabies injection 

## 2011-07-24 ENCOUNTER — Emergency Department (HOSPITAL_COMMUNITY)
Admission: EM | Admit: 2011-07-24 | Discharge: 2011-07-24 | Disposition: A | Discharge status: Home / Self Care | Payer: Self-pay | Source: Home / Self Care | Attending: Family Medicine | Admitting: Family Medicine

## 2011-07-24 ENCOUNTER — Encounter (HOSPITAL_COMMUNITY): Payer: Self-pay | Admitting: *Deleted

## 2011-07-24 MED ORDER — RABIES VACCINE, PCEC IM SUSR
1.0000 mL | Freq: Once | INTRAMUSCULAR | Status: AC
  Administered 2011-07-24: 1 mL via INTRAMUSCULAR

## 2011-07-24 NOTE — Discharge Instructions (Signed)
This is your final Rabies injection!!

## 2011-07-24 NOTE — ED Notes (Signed)
Here for final rabies vaccine

## 2011-09-06 ENCOUNTER — Other Ambulatory Visit: Payer: Self-pay | Admitting: Internal Medicine

## 2011-09-08 ENCOUNTER — Telehealth: Payer: Self-pay

## 2011-09-08 MED ORDER — PRAVASTATIN SODIUM 10 MG PO TABS: 10.0000 mg | ORAL_TABLET | Freq: Every day | ORAL | Status: DC

## 2011-09-08 NOTE — Telephone Encounter (Signed)
Patient called back to say he has not tried any other meds. Per Dr.Hopper patient to switch to pravastatin 10 mg 1 by mouth daily #90.   Patient aware of change in medication

## 2011-09-08 NOTE — Telephone Encounter (Signed)
Left message on voicemail for patient to return call when available. Reason for call was to have patient answer question: Has the patient tried at least one generically available statin (ie . Marland Kitchen Pravastastin, simvastatin, atorvastatin, or lovastatin)  Side note, once this question is answered we can move forward with the prior auth process.

## 2011-09-14 ENCOUNTER — Telehealth: Payer: Self-pay | Admitting: *Deleted

## 2011-09-14 NOTE — Telephone Encounter (Signed)
Pt states that he will be leaving to go out the country on June 8 and will be gone for 10 days. Pt would like to get a Rx for Cipro as a precaution for traveler diarrhea. Please advise

## 2011-09-14 NOTE — Telephone Encounter (Signed)
Cipro 500  mg #10. Also take Pepto Bismol pills on trip

## 2011-09-15 MED ORDER — CIPROFLOXACIN HCL 500 MG PO TABS: 500.0000 mg | ORAL_TABLET | Freq: Two times a day (BID) | ORAL | Status: DC

## 2011-09-15 MED ORDER — CIPROFLOXACIN HCL 500 MG PO TABS: 500.0000 mg | ORAL_TABLET | Freq: Two times a day (BID) | ORAL | Status: AC

## 2011-09-15 NOTE — Telephone Encounter (Signed)
Discuss with patient, Rx sent. 

## 2011-09-15 NOTE — Telephone Encounter (Signed)
Think this should have went to you.  

## 2011-09-15 NOTE — Telephone Encounter (Signed)
Cipro  is 500 mg twice a day if needed

## 2011-09-15 NOTE — Telephone Encounter (Signed)
Please verify sig on med

## 2011-09-15 NOTE — Telephone Encounter (Signed)
RX sent

## 2012-05-28 ENCOUNTER — Ambulatory Visit (INDEPENDENT_AMBULATORY_CARE_PROVIDER_SITE_OTHER): Payer: BC Managed Care – PPO | Admitting: Internal Medicine

## 2012-05-28 ENCOUNTER — Encounter: Payer: Self-pay | Admitting: Internal Medicine

## 2012-05-28 VITALS — BP 118/80 | HR 67 | Temp 98.0°F | Resp 12 | Ht 70.5 in | Wt 191.0 lb

## 2012-05-28 DIAGNOSIS — Z0000 Encounter for general adult medical examination without abnormal findings: Secondary | ICD-10-CM

## 2012-05-28 NOTE — Patient Instructions (Addendum)
If you activate the  My Chart system; lab & Xray results will be released directly  to you as soon as I receive these. If you choose not to use this program; these have to be reviewed, copied & mailed, causing a delay in getting the results to you. As per [facility] all records must be reviewed and signed off within 72 hours. If you've not signed up for My Chart within 48 hours;results will have been addressed &  you will have to wait for results to be reviewed and mailed. Additionally you can use this system to gain direct  access to your records  if  out of town or @ an office of a  physician who is not in  the My Chart network.  This improves continuity of care & places you in control of your medical record.

## 2012-05-28 NOTE — Progress Notes (Signed)
  Subjective:    Patient ID: JJ ENYEART, male    DOB: May 23, 1955, 57 y.o.   MRN: [id number]  HPI  Mr Shaddix is here for a physical;acute issues include chronic R foot pain evaluated by Orthopedist & Podiatriast aspirated some fluid and injected steroids with significant improvement. He diagnosed capsulitis ( code 7-6.90)     Review of Systems He is on a modified  heart healthy diet; he exercises 60 minutes 5-6 times per week on an ellipitical without symptoms. Specifically he denies chest pain, palpitations, dyspnea, or claudication. Family history is positive for premature coronary disease. Advanced cholesterol testing reveals his LDL goal was less than 100, ideally < 70.     Objective:   Physical Exam  Gen.:  well-nourished in appearance. Alert, appropriate and cooperative throughout exam. Appears younger than stated age  Head: Normocephalic without obvious abnormalities  Eyes: No corneal or conjunctival inflammation noted. Pupils equal round reactive to light and accommodation. Fundal exam is benign without hemorrhages, exudate, papilledema. Extraocular motion intact. Vision grossly normal. Ears: External  ear exam reveals no significant lesions or deformities. Canals have wax bilaterally but hearing is grossly normal bilaterally. Nose: External nasal exam reveals no deformity or inflammation. Nasal mucosa are pink and moist. No lesions or exudates noted.   Mouth: Oral mucosa and oropharynx reveal no lesions or exudates. Teeth in good repair. Neck: No deformities, masses, or tenderness noted. Range of motion & Thyroid normal. Lungs: Normal respiratory effort; chest expands symmetrically. Lungs are clear to auscultation without rales, wheezes, or increased work of breathing. Heart: Normal rate and rhythm. Normal S1 and S2. No gallop, click, or rub. No murmur. Abdomen: Bowel sounds normal; abdomen soft and nontender. No masses, organomegaly or hernias noted. Genitalia: Dr  Retta Diones Musculoskeletal/extremities: No deformity or scoliosis noted of  the thoracic or lumbar spine. No clubbing, cyanosis, edema, or significant extremity  deformity noted. Range of motion normal .Tone & strength  normal.Joints normal . Nail health good. Able to lie down & sit up w/o help. Negative SLR bilaterally Vascular: Carotid, radial artery, dorsalis pedis and  posterior tibial pulses are full and equal. No bruits present. Neurologic: Alert and oriented x3. Deep tendon reflexes symmetrical and normal.  Skin: Intact without suspicious lesions or rashes. Lymph: No cervical, axillary lymphadenopathy present. Psych: Mood and affect are normal. Normally interactive                                                                                        Assessment & Plan:  #1 comprehensive physical exam; no acute findings  Plan: see Orders  & Recommendations

## 2012-05-29 ENCOUNTER — Other Ambulatory Visit: Payer: Self-pay | Admitting: Internal Medicine

## 2012-05-29 ENCOUNTER — Encounter: Payer: Self-pay | Admitting: Internal Medicine

## 2012-05-29 LAB — HEPATIC FUNCTION PANEL
AST: 32 U/L (ref 0–37)
Albumin: 4.4 g/dL (ref 3.5–5.2)
Alkaline Phosphatase: 60 U/L (ref 39–117)
Bilirubin, Direct: 0.1 mg/dL (ref 0.0–0.3)
Total Protein: 7.3 g/dL (ref 6.0–8.3)

## 2012-05-29 LAB — LIPID PANEL
HDL: 66.6 mg/dL (ref >39.00)
Total CHOL/HDL Ratio: 3
VLDL: 13 mg/dL (ref 0.0–40.0)

## 2012-05-29 LAB — TSH: TSH: 1.29 u[IU]/mL (ref 0.35–5.50)

## 2012-05-29 LAB — CBC WITH DIFFERENTIAL/PLATELET
Basophils Absolute: 0 10*3/uL (ref 0.0–0.1)
Basophils Relative: 0.5 % (ref 0.0–3.0)
Eosinophils Absolute: 0 10*3/uL (ref 0.0–0.7)
Hemoglobin: 14.7 g/dL (ref 13.0–17.0)
Lymphocytes Relative: 20.6 % (ref 12.0–46.0)
Monocytes Relative: 5.7 % (ref 3.0–12.0)
Neutro Abs: 6.1 10*3/uL (ref 1.4–7.7)
Neutrophils Relative %: 72.6 % (ref 43.0–77.0)
RBC: 4.58 Mil/uL (ref 4.22–5.81)
WBC: 8.4 10*3/uL (ref 4.5–10.5)

## 2012-05-29 LAB — BASIC METABOLIC PANEL
Calcium: 9.3 mg/dL (ref 8.4–10.5)
Creatinine, Ser: 1 mg/dL (ref 0.4–1.5)
GFR: 87.47 mL/min (ref >60.00)
Sodium: 137 mEq/L (ref 135–145)

## 2012-05-30 MED ORDER — PRAVASTATIN SODIUM 10 MG PO TABS: ORAL_TABLET | ORAL | Status: DC

## 2012-05-30 NOTE — Telephone Encounter (Signed)
Continue the pravastatin as this is a very weak medication but is getting him to goal. I believe the segment 3 times a week. Dispense 90, refill x1.

## 2012-05-31 ENCOUNTER — Other Ambulatory Visit: Payer: Self-pay | Admitting: Internal Medicine

## 2012-05-31 DIAGNOSIS — E785 Hyperlipidemia, unspecified: Secondary | ICD-10-CM

## 2012-05-31 DIAGNOSIS — Z8249 Family history of ischemic heart disease and other diseases of the circulatory system: Secondary | ICD-10-CM

## 2012-05-31 MED ORDER — PRAVASTATIN SODIUM 10 MG PO TABS: ORAL_TABLET | ORAL | Status: DC

## 2012-06-01 ENCOUNTER — Encounter: Payer: Self-pay | Admitting: Internal Medicine

## 2012-06-02 ENCOUNTER — Other Ambulatory Visit: Payer: Self-pay

## 2012-06-04 ENCOUNTER — Other Ambulatory Visit: Payer: Self-pay | Admitting: *Deleted

## 2012-06-05 ENCOUNTER — Telehealth: Payer: Self-pay

## 2012-06-05 NOTE — Telephone Encounter (Signed)
Medication was already sent in by MD on 05/31/12

## 2012-06-05 NOTE — Telephone Encounter (Signed)
Message copied by Maurice Small on Tue Jun 05, 2012  4:00 PM ------      Message from: Pecola Lawless      Created: Thu May 31, 2012 10:52 AM       Pravastatin 10 mg #90 refill X3 to [facility] (dose changed to get LDL < 70). I talked to him 2/13 ------

## 2013-02-21 ENCOUNTER — Other Ambulatory Visit: Payer: Self-pay

## 2013-04-08 ENCOUNTER — Encounter: Payer: Self-pay | Admitting: Internal Medicine

## 2013-04-08 MED ORDER — FLUTICASONE PROPIONATE 50 MCG/ACT NA SUSP: 2.0000 | Freq: Every day | NASAL | Status: AC

## 2013-05-29 ENCOUNTER — Encounter: Payer: Self-pay | Admitting: Internal Medicine

## 2013-05-30 ENCOUNTER — Encounter: Payer: Self-pay | Admitting: Internal Medicine

## 2013-05-30 ENCOUNTER — Ambulatory Visit (INDEPENDENT_AMBULATORY_CARE_PROVIDER_SITE_OTHER): Payer: BC Managed Care – PPO | Admitting: Internal Medicine

## 2013-05-30 ENCOUNTER — Other Ambulatory Visit (INDEPENDENT_AMBULATORY_CARE_PROVIDER_SITE_OTHER): Payer: BC Managed Care – PPO

## 2013-05-30 ENCOUNTER — Telehealth: Payer: Self-pay | Admitting: *Deleted

## 2013-05-30 VITALS — BP 122/80 | HR 60 | Temp 97.4°F | Resp 18 | Ht 70.0 in | Wt 190.8 lb

## 2013-05-30 DIAGNOSIS — Z0000 Encounter for general adult medical examination without abnormal findings: Secondary | ICD-10-CM

## 2013-05-30 DIAGNOSIS — E782 Mixed hyperlipidemia: Secondary | ICD-10-CM

## 2013-05-30 LAB — CBC WITH DIFFERENTIAL/PLATELET
BASOS ABS: 0 10*3/uL (ref 0.0–0.1)
Basophils Relative: 0.2 % (ref 0.0–3.0)
EOS ABS: 0.1 10*3/uL (ref 0.0–0.7)
Eosinophils Relative: 0.8 % (ref 0.0–5.0)
HEMATOCRIT: 46.7 % (ref 39.0–52.0)
Hemoglobin: 15.5 g/dL (ref 13.0–17.0)
LYMPHS ABS: 1.7 10*3/uL (ref 0.7–4.0)
Lymphocytes Relative: 25 % (ref 12.0–46.0)
MCHC: 33.2 g/dL (ref 30.0–36.0)
MCV: 95 fl (ref 78.0–100.0)
MONO ABS: 0.5 10*3/uL (ref 0.1–1.0)
Monocytes Relative: 7.1 % (ref 3.0–12.0)
Neutro Abs: 4.5 10*3/uL (ref 1.4–7.7)
Neutrophils Relative %: 66.9 % (ref 43.0–77.0)
PLATELETS: 206 10*3/uL (ref 150.0–400.0)
RBC: 4.91 Mil/uL (ref 4.22–5.81)
RDW: 12.5 % (ref 11.5–14.6)
WBC: 6.7 10*3/uL (ref 4.5–10.5)

## 2013-05-30 LAB — BASIC METABOLIC PANEL
BUN: 21 mg/dL (ref 6–23)
CHLORIDE: 102 meq/L (ref 96–112)
CO2: 29 mEq/L (ref 19–32)
Calcium: 9.4 mg/dL (ref 8.4–10.5)
Creatinine, Ser: 0.9 mg/dL (ref 0.4–1.5)
GFR: 88.23 mL/min (ref >60.00)
GLUCOSE: 95 mg/dL (ref 70–99)
POTASSIUM: 4.1 meq/L (ref 3.5–5.1)
Sodium: 138 mEq/L (ref 135–145)

## 2013-05-30 LAB — URINALYSIS, ROUTINE W REFLEX MICROSCOPIC
BILIRUBIN URINE: NEGATIVE
HGB URINE DIPSTICK: NEGATIVE
Ketones, ur: NEGATIVE
Leukocytes, UA: NEGATIVE
Nitrite: NEGATIVE
PH: 5.5 (ref 5.0–8.0)
Specific Gravity, Urine: 1.025 (ref 1.000–1.030)
TOTAL PROTEIN, URINE-UPE24: NEGATIVE
URINE GLUCOSE: NEGATIVE
Urobilinogen, UA: 0.2 (ref 0.0–1.0)

## 2013-05-30 LAB — HEPATIC FUNCTION PANEL
ALT: 31 U/L (ref 0–53)
AST: 30 U/L (ref 0–37)
Albumin: 4.4 g/dL (ref 3.5–5.2)
Alkaline Phosphatase: 58 U/L (ref 39–117)
BILIRUBIN TOTAL: 0.9 mg/dL (ref 0.3–1.2)
Bilirubin, Direct: 0.1 mg/dL (ref 0.0–0.3)
Total Protein: 7.6 g/dL (ref 6.0–8.3)

## 2013-05-30 LAB — LIPID PANEL
CHOLESTEROL: 153 mg/dL (ref 0–200)
HDL: 64.3 mg/dL (ref >39.00)
LDL CALC: 81 mg/dL (ref 0–99)
TRIGLYCERIDES: 39 mg/dL (ref 0.0–149.0)
Total CHOL/HDL Ratio: 2
VLDL: 7.8 mg/dL (ref 0.0–40.0)

## 2013-05-30 LAB — TSH: TSH: 1.56 u[IU]/mL (ref 0.35–5.50)

## 2013-05-30 NOTE — Patient Instructions (Addendum)
Your next office appointment will be determined based upon review of your pending labs. Those instructions will be transmitted to you through My Chart. Preventive Health Care: Exercise at least 30-45 minutes a day,  3-4 days a week.  Eat a low-fat diet with lots of fruits and vegetables, up to 7-9 servings per day. This would eliminate the need for vitamin supplements. Alcohol If you drink, do it moderately,less than 9 drinks per week, preferably less than 6 @ most. Take the EKG to any emergency room or preop visits. There were nonspecific changes which have resolved; as long as there is no new change these are not clinically significant

## 2013-05-30 NOTE — Progress Notes (Signed)
   Subjective:    Patient ID: Jason Vaughn, male    DOB: November 20, 1955, 58 y.o.   MRN: [id number]  HPI  He is here for a physical;acute issues include urinary frequency for which he sees Dr Diona Fanti. No associated polyphagia or polydipsia    Review of Systems A heart healthy diet is followed; exercise encompasses 60 minutes 4  times per week as  elliptical without symptoms.  Family history is + for premature coronary disease in maternal family Advanced cholesterol testing reveals  LDL goal is less than 100 ; ideally < 70. There is medication compliance with the statin.  Low dose ASA taken Specifically denied are  chest pain, palpitations, dyspnea, or claudication.  Significant abdominal symptoms, memory deficit, or myalgias not present except R medial thigh cramp on occasion.     Objective:   Physical Exam Gen.: Thin but ealthy and well-nourished in appearance. Alert, appropriate and cooperative throughout exam. Appears younger than stated age  Head: Normocephalic without obvious abnormalities Eyes: No corneal or conjunctival inflammation noted. Pupils equal round reactive to light and accommodation. Extraocular motion intact.  Ears: External  ear exam reveals no significant lesions or deformities. Canals with wax bilaterally. Hearing is grossly normal bilaterally. Nose: External nasal exam reveals no deformity or inflammation. Nasal mucosa are pink and moist. No lesions or exudates noted.   Mouth: Oral mucosa and oropharynx reveal no lesions or exudates. Teeth in good repair. Neck: No deformities, masses, or tenderness noted. Range of motion &. Thyroid normal. Lungs: Normal respiratory effort; chest expands symmetrically. Lungs are clear to auscultation without rales, wheezes, or increased work of breathing. Heart: Slow rate and regular rhythm. Normal S1 and S2. No gallop, click, or rub. No murmur. Abdomen: Bowel sounds normal; abdomen soft and nontender. No masses, organomegaly or  hernias noted. Genitalia:  as per Dr Diona Fanti                                  Musculoskeletal/extremities: No deformity or scoliosis noted of  the thoracic or lumbar spine.   No clubbing, cyanosis, edema, or significant extremity  deformity noted. Range of motion normal .Tone & strength normal. Hand joints normal . Fingernail health good. Able to lie down & sit up w/o help. Negative SLR bilaterally Vascular: Carotid, radial artery, dorsalis pedis and  posterior tibial pulses are full and equal. No bruits present. Neurologic: Alert and oriented x3. Deep tendon reflexes symmetrical and normal.  Gait normal         Skin: Intact without suspicious lesions or rashes. Lymph: No cervical, axillary lymphadenopathy present. Psych: Mood and affect are normal. Normally interactive                                                                                        Assessment & Plan:  #1 comprehensive physical exam; no acute findings  Plan: see Orders  & Recommendations

## 2013-05-30 NOTE — Telephone Encounter (Signed)
Per lab, order was placed incorrectly as POCT, corrected/SLS

## 2013-06-14 ENCOUNTER — Encounter: Payer: Self-pay | Admitting: Internal Medicine

## 2013-06-17 ENCOUNTER — Ambulatory Visit (INDEPENDENT_AMBULATORY_CARE_PROVIDER_SITE_OTHER): Payer: BC Managed Care – PPO | Admitting: Internal Medicine

## 2013-06-17 ENCOUNTER — Encounter: Payer: Self-pay | Admitting: Internal Medicine

## 2013-06-17 VITALS — BP 100/60 | HR 72 | Temp 98.2°F | Resp 13 | Wt 193.1 lb

## 2013-06-17 DIAGNOSIS — R109 Unspecified abdominal pain: Secondary | ICD-10-CM

## 2013-06-17 DIAGNOSIS — Z129 Encounter for screening for malignant neoplasm, site unspecified: Secondary | ICD-10-CM

## 2013-06-17 NOTE — Progress Notes (Signed)
Pre visit review using our clinic review tool, if applicable. No additional management support is needed unless otherwise documented below in the visit note. 

## 2013-06-17 NOTE — Patient Instructions (Signed)
Use an anti-inflammatory cream such as Aspercreme or Zostrix cream twice a day to the affected area as needed. In lieu of this warm moist compresses or  hot water bottle can be used. Do not apply ice . 

## 2013-06-17 NOTE — Progress Notes (Signed)
   Subjective:    Patient ID: Jason Vaughn, male    DOB: [birth year], 58 y.o.   MRN: [id number]  HPI His symptoms began 5-6 days ago in the right abdomen approximately 6 inches below the areola in 2-3 inches from umbilicus. It is transitory in nature. It is nonradiating; there are no factors which exacerbate or improve the discomfort. The course is one of improvement. The pain was last experienced 3/1 and was less severe than last week. He's had no discomfort today.  There was no specific trigger for this but he questions whether it might be abdominal wall discomfort from a new abdominal exercise employing a wheel  He has had a 3-4 pound weight gain last 30 days.  Past history includes diverticulitis. He has had a colonoscopy most recently 10 years ago    Review of Systems  He has no dysphasia, dyspepsia, anorexia, hematemesis, constitutional symptoms; dysuria, pyuria, or hematuria. He also has no back pain with anterior radiation.     Objective:   Physical Exam General appearance is one of good health and nourishment w/o distress.  Eyes: No conjunctival inflammation or scleral icterus is present.  Oral exam: Dental hygiene is good; lips and gums are healthy appearing.There is no oropharyngeal erythema or exudate noted.   Heart:  Normal rate and regular rhythm. S1 and S2 normal without gallop, murmur, click, rub or other extra sounds     Lungs:Chest clear to auscultation; no wheezes, rhonchi,rales ,or rubs present.No increased work of breathing.   Abdomen: bowel sounds normal, soft and non-tender without masses, organomegaly or hernias noted.  No guarding or rebound . No tenderness over the flanks to percussion  Musculoskeletal: Able to lie flat and sit up without help. Negative straight leg raising bilaterally. Gait normal  Skin:Warm & dry.  Intact without suspicious lesions or rashes ; no jaundice or tenting  Lymphatic: No lymphadenopathy is noted about the head, neck, axilla,  or inguinal areas.                Assessment & Plan:  #1 abdominal wall pain, resolved See AVS

## 2013-06-17 NOTE — Progress Notes (Signed)
HPI: Stomach pain (8/10) started 5-6 days ago 6-7 inches  below right nipple, 2-3 inches over from navel. Happens infrequently and randomly and lasts for about 10 seconds. Has subsided in past few days (pain 4/10) and no episodes today. No associated nausea/ vomiting, heartburn, or diarrhea.   Began new abdominal exercise 7-10 days ago which may be related to the pain.   ROS: Colonoscopy 10 years ago that showed diverticulitis.   Physical exam: Gen.: Healthy and well-nourished in appearance. Alert, appropriate and cooperative throughout exam.  Head: Normocephalic without obvious abnormalities;  Eyes: No corneal or conjunctival inflammation noted. Pupils equal round reactive to light and accommodation.  Nose: External nasal exam reveals no deformity or inflammation. Nasal mucosa are pink and moist. No lesions or exudates noted.   Mouth: Oral mucosa and oropharynx reveal no lesions or exudates. Neck: No deformities, masses, or tenderness noted.Good  range of motion. Thyroid -no nodules. Lungs: Normal respiratory effort; chest expands symmetrically. Lungs are clear to auscultation without rales, wheezes, or increased work of breathing. Heart: Normal rate and rhythm. Normal S1 and S2. No gallop, click, or rub. Abdomen: Bowel sounds normal; abdomen soft and nontender. No masses, organomegaly or hernias noted.                               Able to lie down & sit up w/o help. Vascular: Carotid, radial artery, dorsalis pedis and  posterior tibial pulses are full and equal.  Musculoskeletal: negative straight leg raise Neurologic: Alert and oriented x3.  Skin: Intact without suspicious lesions or rashes. Lymph: No cervical, axillary  lymphadenopathy present. Psych: Mood and affect are normal. Normally interactive

## 2013-06-26 ENCOUNTER — Encounter: Payer: Self-pay | Admitting: Gastroenterology

## 2013-08-05 ENCOUNTER — Other Ambulatory Visit: Payer: Self-pay | Admitting: Internal Medicine

## 2013-08-05 ENCOUNTER — Ambulatory Visit (AMBULATORY_SURGERY_CENTER): Payer: BC Managed Care – PPO | Admitting: *Deleted

## 2013-08-05 VITALS — Ht 70.0 in | Wt 189.0 lb

## 2013-08-05 DIAGNOSIS — Z1211 Encounter for screening for malignant neoplasm of colon: Secondary | ICD-10-CM

## 2013-08-05 MED ORDER — NA SULFATE-K SULFATE-MG SULF 17.5-3.13-1.6 GM/177ML PO SOLN: ORAL | Status: DC

## 2013-08-05 NOTE — Progress Notes (Signed)
Patient denies any allergies to eggs or soy. Patient denies any problems with anesthesia. No oxygen use at home per patient. No diet pills. EMMI information given to patient on colonoscopy.

## 2013-08-07 ENCOUNTER — Encounter: Payer: Self-pay | Admitting: Gastroenterology

## 2013-08-26 ENCOUNTER — Ambulatory Visit (AMBULATORY_SURGERY_CENTER): Payer: BC Managed Care – PPO | Admitting: Gastroenterology

## 2013-08-26 ENCOUNTER — Encounter: Payer: Self-pay | Admitting: Gastroenterology

## 2013-08-26 ENCOUNTER — Encounter: Payer: BC Managed Care – PPO | Admitting: Gastroenterology

## 2013-08-26 VITALS — BP 110/76 | HR 47 | Temp 96.0°F | Resp 15 | Ht 70.0 in | Wt 189.0 lb

## 2013-08-26 DIAGNOSIS — Z1211 Encounter for screening for malignant neoplasm of colon: Secondary | ICD-10-CM

## 2013-08-26 DIAGNOSIS — K5730 Diverticulosis of large intestine without perforation or abscess without bleeding: Secondary | ICD-10-CM

## 2013-08-26 MED ORDER — SODIUM CHLORIDE 0.9 % IV SOLN: 500.0000 mL | INTRAVENOUS | Status: DC

## 2013-08-26 NOTE — Patient Instructions (Signed)
YOU HAD AN ENDOSCOPIC PROCEDURE TODAY AT THE [facility]: Refer to the procedure report that was given to you for any specific questions about what was found during the examination.  If the procedure report does not answer your questions, please call your gastroenterologist to clarify.  If you requested that your care partner not be given the details of your procedure findings, then the procedure report has been included in a sealed envelope for you to review at your convenience later.  YOU SHOULD EXPECT: Some feelings of bloating in the abdomen. Passage of more gas than usual.  Walking can help get rid of the air that was put into your GI tract during the procedure and reduce the bloating. If you had a lower endoscopy (such as a colonoscopy or flexible sigmoidoscopy) you may notice spotting of blood in your stool or on the toilet paper. If you underwent a bowel prep for your procedure, then you may not have a normal bowel movement for a few days.  DIET: Your first meal following the procedure should be a light meal and then it is ok to progress to your normal diet.  A half-sandwich or bowl of soup is an example of a good first meal.  Heavy or fried foods are harder to digest and may make you feel nauseous or bloated.  Likewise meals heavy in dairy and vegetables can cause extra gas to form and this can also increase the bloating.  Drink plenty of fluids but you should avoid alcoholic beverages for 24 hours.  ACTIVITY: Your care partner should take you home directly after the procedure.  You should plan to take it easy, moving slowly for the rest of the day.  You can resume normal activity the day after the procedure however you should NOT DRIVE or use heavy machinery for 24 hours (because of the sedation medicines used during the test).    SYMPTOMS TO REPORT IMMEDIATELY: A gastroenterologist can be reached at any hour.  During normal business hours, 8:30 AM to 5:00 PM Monday through Friday,  call [phone].  After hours and on weekends, please call the GI answering service at [phone] who will take a message and have the physician on call contact you.   Following lower endoscopy (colonoscopy or flexible sigmoidoscopy):  Excessive amounts of blood in the stool  Significant tenderness or worsening of abdominal pains  Swelling of the abdomen that is new, acute  Fever of 100F or higher  FOLLOW UP: If any biopsies were taken you will be contacted by phone or by letter within the next 1-3 weeks.  Call your gastroenterologist if you have not heard about the biopsies in 3 weeks.  Our staff will call the home number listed on your records the next business day following your procedure to check on you and address any questions or concerns that you may have at that time regarding the information given to you following your procedure. This is a courtesy call and so if there is no answer at the home number and we have not heard from you through the emergency physician on call, we will assume that you have returned to your regular daily activities without incident.  SIGNATURES/CONFIDENTIALITY: You and/or your care partner have signed paperwork which will be entered into your electronic medical record.  These signatures attest to the fact that that the information above on your After Visit Summary has been reviewed and is understood.  Full responsibility of the confidentiality of this   discharge information lies with you and/or your care-partner.  Mild diverticulosis.  Otherwise normal colon.  Recommend colonoscopy in 10 years.

## 2013-08-26 NOTE — Op Note (Signed)
[place]  [address] [place], [address]   COLONOSCOPY PROCEDURE REPORT  PATIENT: Jason Vaughn, Jason Vaughn  MR#: [id number] BIRTHDATE: 1955-05-30 , 58  yrs. old GENDER: Male ENDOSCOPIST: Inda Castle, MD REFERRED BY: PROCEDURE DATE:  08/26/2013 PROCEDURE:   Colonoscopy, diagnostic First Screening Colonoscopy - Avg.  risk and is 50 yrs.  old or older - No.  Prior Negative Screening - Now for repeat screening. 10 or more years since last screening  History of Adenoma - Now for follow-up colonoscopy & has been > or = to 3 yrs.  N/A  Polyps Removed Today? No.  Recommend repeat exam, <10 yrs? No. ASA CLASS:   Class II INDICATIONS:Average risk patient for colon cancer. MEDICATIONS: MAC sedation, administered by CRNA and propofol (Diprivan) 200mg  IV  DESCRIPTION OF PROCEDURE:   After the risks benefits and alternatives of the procedure were thoroughly explained, informed consent was obtained.  A digital rectal exam revealed no abnormalities of the rectum.   The [id number]  endoscope was introduced through the anus and advanced to the cecum, which was identified by both the appendix and ileocecal valve. No adverse events experienced.   The quality of the prep was excellent using Suprep  The instrument was then slowly withdrawn as the colon was fully examined.      COLON FINDINGS: Mild diverticulosis was noted in the sigmoid colon. The colon was otherwise normal.  There was no diverticulosis, inflammation, polyps or cancers unless previously stated. Retroflexed views revealed no abnormalities. The time to cecum=2 minutes 31 seconds.  Withdrawal time=8 minutes 20 seconds.  The scope was withdrawn and the procedure completed. COMPLICATIONS: There were no complications.  ENDOSCOPIC IMPRESSION: 1.   Mild diverticulosis was noted in the sigmoid colon 2.   The colon was otherwise normal  RECOMMENDATIONS: Continue current colorectal screening  recommendations for "routine risk" patients with a repeat colonoscopy in 10 years.   eSigned:  Inda Castle, MD 08/26/2013 9:17 AM   cc: Hendricks Limes, MD and Rico Sheehan, MD   PATIENT NAME:  Jason Vaughn, Jason Vaughn MR#: [id number]

## 2013-08-26 NOTE — Progress Notes (Signed)
Report to pacu rn, vss, bbs=clear 

## 2013-08-27 ENCOUNTER — Telehealth: Payer: Self-pay | Admitting: *Deleted

## 2013-08-27 NOTE — Telephone Encounter (Signed)
  Follow up Call-  Call back number 08/26/2013  Post procedure Call Back phone  # [phone]  Permission to leave phone message Yes     Patient questions:  Do you have a fever, pain , or abdominal swelling? no Pain Score  0 *  Have you tolerated food without any problems? yes  Have you been able to return to your normal activities? yes  Do you have any questions about your discharge instructions: Diet   no Medications  no Follow up visit  no  Do you have questions or concerns about your Care? no  Actions: * If pain score is 4 or above: No action needed, pain <4.

## 2013-11-06 ENCOUNTER — Ambulatory Visit (INDEPENDENT_AMBULATORY_CARE_PROVIDER_SITE_OTHER): Payer: BC Managed Care – PPO | Admitting: Internal Medicine

## 2013-11-06 ENCOUNTER — Encounter: Payer: Self-pay | Admitting: Internal Medicine

## 2013-11-06 VITALS — BP 128/80 | HR 61 | Temp 97.9°F | Wt 185.8 lb

## 2013-11-06 DIAGNOSIS — IMO0002 Reserved for concepts with insufficient information to code with codable children: Secondary | ICD-10-CM

## 2013-11-06 DIAGNOSIS — K4090 Unilateral inguinal hernia, without obstruction or gangrene, not specified as recurrent: Secondary | ICD-10-CM

## 2013-11-06 MED ORDER — TRAMADOL HCL 50 MG PO TABS: 50.0000 mg | ORAL_TABLET | Freq: Four times a day (QID) | ORAL | Status: DC | PRN

## 2013-11-06 NOTE — Progress Notes (Signed)
Pre visit review using our clinic review tool, if applicable. No additional management support is needed unless otherwise documented below in the visit note. 

## 2013-11-06 NOTE — Patient Instructions (Signed)
Fill tub with hot water and soak in it twice a day to help relieve the soft tissue/musculoskeletal pain.

## 2013-11-06 NOTE — Progress Notes (Signed)
   Subjective:    Patient ID: Jason Vaughn, male    DOB: [birth year], 58 y.o.   MRN: [id number]  HPI    His symptoms began approximately 3 weeks ago as tightness in the bilateral inguinal areas the day after doing repetitive inverted sit-ups a 45 incline  He describes a discomfort in the medial thigh bilaterally, greater on the right than the left.  Symptoms did improve with avoidance of the repetitive activities but  have recurred & have been as severe as a level X.  Yesterday pain also was present in the inner posterior thigh area as well.    Review of Systems  Dysuria, pyuria, hematuria denied.  No unexplained weight loss, abdominal pain, melena, or rectal bleeding.      Objective:   Physical Exam  He has pain with lateral rotation of the hips in the medial thigh bilaterally.  He has small reducible direct hernias bilaterally. There is a small reducible right indirect hernia as well.  The remainder of the exam was unremarkable  General appearance is one of good health and nourishment w/o distress.  Eyes: No conjunctival inflammation or scleral icterus is present.   Heart:  Normal rate and regular rhythm. S1 and S2 normal without gallop, murmur, click, rub or other extra sounds     Lungs:Chest clear to auscultation; no wheezes, rhonchi,rales ,or rubs present.No increased work of breathing.   Abdomen: bowel sounds normal, soft and non-tender without masses, organomegaly or hernias noted.  No guarding or rebound . No tenderness over the flanks to percussion  Musculoskeletal: Able to lie flat and sit up without help. Negative straight leg raising bilaterally. Gait normal  Skin:Warm & dry.  Intact without suspicious lesions or rashes ; no jaundice or tenting  Lymphatic: No lymphadenopathy is noted about the head, neck, axilla, or inguinal areas.                Assessment & Plan:  #1 muscular and ligamentous strain from repetitive motion  #2 small direct  hernia bilaterally and indirect hernia on the right; all reducible  See orders and recommendations

## 2013-11-26 ENCOUNTER — Encounter: Payer: Self-pay | Admitting: Internal Medicine

## 2014-01-10 ENCOUNTER — Ambulatory Visit (INDEPENDENT_AMBULATORY_CARE_PROVIDER_SITE_OTHER): Payer: BC Managed Care – PPO | Admitting: Surgery

## 2014-02-07 ENCOUNTER — Telehealth: Payer: Self-pay | Admitting: Surgery

## 2014-02-07 NOTE — Telephone Encounter (Signed)
Documented in the wrong patient's chart

## 2014-02-07 NOTE — Telephone Encounter (Deleted)
Documentation in this chart

## 2014-04-18 HISTORY — PX: SHOULDER SURGERY: SHX246

## 2014-06-02 ENCOUNTER — Ambulatory Visit (INDEPENDENT_AMBULATORY_CARE_PROVIDER_SITE_OTHER): Payer: BLUE CROSS/BLUE SHIELD | Admitting: Internal Medicine

## 2014-06-02 ENCOUNTER — Encounter: Payer: BC Managed Care – PPO | Admitting: Internal Medicine

## 2014-06-02 ENCOUNTER — Encounter: Payer: Self-pay | Admitting: Internal Medicine

## 2014-06-02 VITALS — BP 120/90 | HR 66 | Temp 97.5°F | Ht 70.0 in | Wt 185.0 lb

## 2014-06-02 DIAGNOSIS — Z0000 Encounter for general adult medical examination without abnormal findings: Secondary | ICD-10-CM

## 2014-06-02 DIAGNOSIS — Z0189 Encounter for other specified special examinations: Secondary | ICD-10-CM

## 2014-06-02 DIAGNOSIS — J3089 Other allergic rhinitis: Secondary | ICD-10-CM

## 2014-06-02 DIAGNOSIS — E782 Mixed hyperlipidemia: Secondary | ICD-10-CM

## 2014-06-02 NOTE — Progress Notes (Signed)
Subjective:    Patient ID: Jason Vaughn, male    DOB: February 20, 1956, 59 y.o.   MRN: [id number]  HPI  He is here for a physical;acute issues described below.   He is on a heart healthy diet; he exercises four-5 times per week for 60 minutes on elliptical. He has no associated cardiopulmonary symptoms.  There is a family history of premature heart disease. Based on advanced cholesterol testing his LDL goal is less than 100, ideally less than 70. He has been compliant with his statin without adverse effects.   Colonoscopy is up-to-date; he has had no colon polyps. He has no active GI symptoms.  Review of Systems He does have occasional bilateral lower back pain laterally which radiates superiorly. He denies numbness, tingling, weakness in the lower extremities. He has no loss of control of bladder or bowels.  He has seen his Urologist & is on medication for BPH with lower urinary tract symptoms. These include nocturia 2-3 times per night as well as some difficulty starting the stream at times.  Significant headaches, epistaxis, chest pain, palpitations, exertional dyspnea, claudication, paroxysmal nocturnal dyspnea, or edema absent. No GI symptoms , memory loss or myalgias   Unexplained weight loss, abdominal pain, significant dyspepsia, dysphagia, melena, rectal bleeding, or persistently small caliber stools are denied.      Objective:   Physical Exam  Gen.: Adequately nourished in appearance. Alert, appropriate and cooperative throughout exam. Appears younger than stated age  Head: Normocephalic without obvious abnormalities;  pattern alopecia  Eyes: No corneal or conjunctival inflammation noted. Pupils equal round reactive to light and accommodation. Extraocular motion intact.  Ears: External  ear exam reveals no significant lesions or deformities. Wax bilateral. Hearing is grossly normal bilaterally. Nose: External nasal exam reveals no deformity or inflammation. Nasal mucosa are  pink and moist. No lesions or exudates noted.   Mouth: Oral mucosa and oropharynx reveal no lesions or exudates. Teeth in good repair. Neck: No deformities, masses, or tenderness noted. Range of motion & Thyroid normal. Lungs: Normal respiratory effort; chest expands symmetrically. Lungs are clear to auscultation without rales, wheezes, or increased work of breathing. Heart: Normal rate and rhythm. Normal S1 and S2. No gallop or rub. Click @ apex w/o murmur. Abdomen: Bowel sounds normal; abdomen soft and nontender. No masses, organomegaly or hernias noted. Genitalia: as per Dr Diona Fanti                        Musculoskeletal/extremities: No deformity or scoliosis noted of  the thoracic or lumbar spine.  No clubbing, cyanosis, edema, or significant extremity  deformity noted.  Range of motion normal . Tone & strength normal. Hand joints normal Fingernail  health good. Crepitus of knees  Able to lie down & sit up w/o help.  Negative SLR bilaterally Vascular: Carotid, radial artery, dorsalis pedis and  posterior tibial pulses are full and equal. No bruits present. Neurologic: Alert and oriented x3. Deep tendon reflexes symmetrical and normal.  Gait normal      Skin: Intact without suspicious lesions or rashes. Lymph: No cervical, axillary lymphadenopathy present. Psych: Mood and affect are normal. Normally interactive  Assessment & Plan:  #1 comprehensive physical exam; no acute findings  Plan: see Orders  & Recommendations

## 2014-06-02 NOTE — Progress Notes (Signed)
Pre visit review using our clinic review tool, if applicable. No additional management support is needed unless otherwise documented below in the visit note. 

## 2014-06-02 NOTE — Patient Instructions (Signed)
Please do not use Q-tips as this simply packs the wax down against he eardrum. Should wax build up occur, please put 2-3 drops of mineral oil in the ear at night and cover the canal with a  cotton ball.In the morning fill the canal with hydrogen peroxide & leave  for 10-15 minutes.Following this shower and use the thinnest washrag available to wick out the wax.   Plain Mucinex (NOT D) for thick secretions ;force NON dairy fluids .   Nasal cleansing in the shower as discussed with lather of mild shampoo.After 10 seconds wash off lather while  exhaling through nostrils. Make sure that all residual soap is removed to prevent irritation.  Flonase OR Nasacort AQ 1 spray in each nostril twice a day as needed. Use the "crossover" technique into opposite nostril spraying toward opposite ear @ 45 degree angle, not straight up into nostril.  Plain Allegra (NOT D )  160 daily , Loratidine 10 mg , OR Zyrtec 10 mg @ bedtime  as needed for itchy eyes & sneezing.  Your next office appointment will be determined based upon review of your pending labs . Those instructions will be transmitted to you through My Chart Critical values will be called.   Followup as needed for any active or acute issue. Please report any significant change in your symptoms. The best exercises for the low back include freestyle swimming, stretch aerobics, and yoga.Cybex & Nautilus machines rather than dead weights are better for the back.

## 2014-06-03 ENCOUNTER — Other Ambulatory Visit (INDEPENDENT_AMBULATORY_CARE_PROVIDER_SITE_OTHER): Payer: BLUE CROSS/BLUE SHIELD

## 2014-06-03 DIAGNOSIS — Z0000 Encounter for general adult medical examination without abnormal findings: Secondary | ICD-10-CM

## 2014-06-03 DIAGNOSIS — Z0189 Encounter for other specified special examinations: Secondary | ICD-10-CM

## 2014-06-03 LAB — CBC WITH DIFFERENTIAL/PLATELET
Basophils Absolute: 0 10*3/uL (ref 0.0–0.1)
Basophils Relative: 0.5 % (ref 0.0–3.0)
EOS PCT: 1.5 % (ref 0.0–5.0)
Eosinophils Absolute: 0.1 10*3/uL (ref 0.0–0.7)
HEMATOCRIT: 44.8 % (ref 39.0–52.0)
HEMOGLOBIN: 15.5 g/dL (ref 13.0–17.0)
LYMPHS ABS: 2.2 10*3/uL (ref 0.7–4.0)
LYMPHS PCT: 34.8 % (ref 12.0–46.0)
MCHC: 34.7 g/dL (ref 30.0–36.0)
MCV: 91.5 fl (ref 78.0–100.0)
MONOS PCT: 7.4 % (ref 3.0–12.0)
Monocytes Absolute: 0.5 10*3/uL (ref 0.1–1.0)
NEUTROS ABS: 3.5 10*3/uL (ref 1.4–7.7)
Neutrophils Relative %: 55.8 % (ref 43.0–77.0)
Platelets: 220 10*3/uL (ref 150.0–400.0)
RBC: 4.9 Mil/uL (ref 4.22–5.81)
RDW: 12.6 % (ref 11.5–15.5)
WBC: 6.4 10*3/uL (ref 4.0–10.5)

## 2014-06-03 LAB — BASIC METABOLIC PANEL
BUN: 21 mg/dL (ref 6–23)
CO2: 30 mEq/L (ref 19–32)
Calcium: 9.4 mg/dL (ref 8.4–10.5)
Chloride: 103 mEq/L (ref 96–112)
Creatinine, Ser: 0.97 mg/dL (ref 0.40–1.50)
GFR: 84.78 mL/min (ref >60.00)
Glucose, Bld: 106 mg/dL — ABNORMAL HIGH (ref 70–99)
Potassium: 4.1 mEq/L (ref 3.5–5.1)
SODIUM: 139 meq/L (ref 135–145)

## 2014-06-03 LAB — HEPATIC FUNCTION PANEL
ALT: 22 U/L (ref 0–53)
AST: 22 U/L (ref 0–37)
Albumin: 4.1 g/dL (ref 3.5–5.2)
Alkaline Phosphatase: 59 U/L (ref 39–117)
Bilirubin, Direct: 0.1 mg/dL (ref 0.0–0.3)
Total Bilirubin: 0.5 mg/dL (ref 0.2–1.2)
Total Protein: 7.1 g/dL (ref 6.0–8.3)

## 2014-06-03 LAB — LIPID PANEL
CHOLESTEROL: 169 mg/dL (ref 0–200)
HDL: 52.6 mg/dL (ref >39.00)
LDL Cholesterol: 106 mg/dL — ABNORMAL HIGH (ref 0–99)
NonHDL: 116.4
TRIGLYCERIDES: 50 mg/dL (ref 0.0–149.0)
Total CHOL/HDL Ratio: 3
VLDL: 10 mg/dL (ref 0.0–40.0)

## 2014-06-03 LAB — TSH: TSH: 3.54 u[IU]/mL (ref 0.35–4.50)

## 2014-06-04 ENCOUNTER — Telehealth: Payer: Self-pay

## 2014-06-04 ENCOUNTER — Encounter: Payer: Self-pay | Admitting: Internal Medicine

## 2014-06-04 ENCOUNTER — Other Ambulatory Visit (INDEPENDENT_AMBULATORY_CARE_PROVIDER_SITE_OTHER): Payer: BLUE CROSS/BLUE SHIELD

## 2014-06-04 DIAGNOSIS — R739 Hyperglycemia, unspecified: Secondary | ICD-10-CM

## 2014-06-04 LAB — HEMOGLOBIN A1C: Hgb A1c MFr Bld: 5.5 % (ref 4.6–6.5)

## 2014-06-04 NOTE — Telephone Encounter (Signed)
Request for add on has been faxed to lab 

## 2014-06-04 NOTE — Telephone Encounter (Signed)
-----   Message from Hendricks Limes, MD sent at 06/03/2014  6:03 PM EST ----- Please add A1c (R73.9)

## 2014-08-19 ENCOUNTER — Other Ambulatory Visit: Payer: Self-pay | Admitting: Orthopedic Surgery

## 2014-08-19 DIAGNOSIS — M25512 Pain in left shoulder: Secondary | ICD-10-CM

## 2014-08-29 ENCOUNTER — Encounter: Payer: Self-pay | Admitting: Internal Medicine

## 2014-08-31 ENCOUNTER — Other Ambulatory Visit: Payer: BLUE CROSS/BLUE SHIELD

## 2014-09-01 ENCOUNTER — Encounter: Payer: Self-pay | Admitting: Internal Medicine

## 2014-09-01 ENCOUNTER — Ambulatory Visit
Admission: RE | Admit: 2014-09-01 | Discharge: 2014-09-01 | Disposition: A | Discharge status: Home / Self Care | Payer: BLUE CROSS/BLUE SHIELD | Source: Ambulatory Visit | Attending: Orthopedic Surgery | Admitting: Orthopedic Surgery

## 2014-09-01 DIAGNOSIS — M25512 Pain in left shoulder: Secondary | ICD-10-CM

## 2014-11-07 ENCOUNTER — Other Ambulatory Visit: Payer: Self-pay | Admitting: Emergency Medicine

## 2014-11-07 MED ORDER — PRAVASTATIN SODIUM 10 MG PO TABS: ORAL_TABLET | ORAL | Status: DC

## 2014-11-10 HISTORY — PX: INGUINAL HERNIA REPAIR: SUR1180

## 2015-01-28 ENCOUNTER — Encounter: Payer: Self-pay | Admitting: Internal Medicine

## 2015-01-28 ENCOUNTER — Ambulatory Visit (INDEPENDENT_AMBULATORY_CARE_PROVIDER_SITE_OTHER): Payer: BLUE CROSS/BLUE SHIELD | Admitting: Internal Medicine

## 2015-01-28 ENCOUNTER — Other Ambulatory Visit: Payer: BLUE CROSS/BLUE SHIELD

## 2015-01-28 ENCOUNTER — Other Ambulatory Visit: Payer: Self-pay | Admitting: Internal Medicine

## 2015-01-28 VITALS — BP 120/78 | HR 68 | Temp 98.2°F | Ht 70.0 in | Wt 184.2 lb

## 2015-01-28 DIAGNOSIS — Z23 Encounter for immunization: Secondary | ICD-10-CM

## 2015-01-28 DIAGNOSIS — R05 Cough: Secondary | ICD-10-CM

## 2015-01-28 DIAGNOSIS — Z8719 Personal history of other diseases of the digestive system: Secondary | ICD-10-CM | POA: Diagnosis not present

## 2015-01-28 DIAGNOSIS — Z1159 Encounter for screening for other viral diseases: Secondary | ICD-10-CM | POA: Diagnosis not present

## 2015-01-28 DIAGNOSIS — R059 Cough, unspecified: Secondary | ICD-10-CM

## 2015-01-28 DIAGNOSIS — K2100 Gastro-esophageal reflux disease with esophagitis, without bleeding: Secondary | ICD-10-CM

## 2015-01-28 MED ORDER — RANITIDINE HCL 150 MG PO TABS: 150.0000 mg | ORAL_TABLET | Freq: Two times a day (BID) | ORAL | Status: DC

## 2015-01-28 NOTE — Progress Notes (Signed)
Pre visit review using our clinic review tool, if applicable. No additional management support is needed unless otherwise documented below in the visit note. 

## 2015-01-28 NOTE — Patient Instructions (Signed)
Reflux of gastric acid may be asymptomatic as this may occur mainly during sleep.The triggers for reflux  include stress; the "aspirin family" ; alcohol; peppermint; and caffeine (coffee, tea, cola, and chocolate). The aspirin family would include aspirin and the nonsteroidal agents such as ibuprofen &  Naproxen. Tylenol would not cause reflux. If having symptoms ; food & drink should be avoided for @ least 2 hours before going to bed.  

## 2015-01-28 NOTE — Progress Notes (Signed)
   Subjective:    Patient ID: Jason Vaughn, male    DOB: 03-13-1956, 59 y.o.   MRN: [id number]  HPI He's had dyspepsia for 10 days associated with burping, decreased appetite, and early satiety. If he tries to eat beyond the sensation of early satiety he will have nausea. He had been on meloxicam for shoulder pain but discontinued this and baby aspirin 2 weeks ago. He does have 1.5 cups coffee a day; eats spicy food; drinks some cola and 1-2 beers per week. Tums was of some benefit.  He also had a "cold" a month and half ago. He believes congestion "went to his chest". He will have scant clear sputum at times. He has had some sneezing. He feels warm at night but denies definite fever.  Colonoscopy was done in 2005 & 2015.Diverticulosis was present. He's not had an upper endoscopy.  He requests Hepatitis C screening.   Review of Systems  There is no purulent sputum production,hemoptysis, wheezing,or  paroxysmal nocturnal dyspnea. Frontal headache, facial pain , nasal purulence, dental pain, sore throat , otic pain or otic discharge denied. No chills or sweats. Unexplained weight loss, abdominal pain,dysphagia, melena, rectal bleeding, or persistently small caliber stools are not present. Dysuria, pyuria, hematuria, frequency, nocturia or polyuria are denied.     Objective:   Physical Exam  Pertinent or positive findings include: Pattern alopecia is present. He has bilateral ptosis. Heart rate is slow. He has laparotomy scars of the abdomen which are well-healed.  General appearance :adequately nourished; in no distress.  Eyes: No conjunctival inflammation or scleral icterus is present.  Oral exam:  Lips and gums are healthy appearing.There is no oropharyngeal erythema or exudate noted. Dental hygiene is good.  Heart:  Regular rhythm. S1 and S2 normal without gallop, murmur, click, rub or other extra sounds    Lungs:Chest clear to auscultation; no wheezes, rhonchi,rales ,or rubs  present.No increased work of breathing.   Abdomen: bowel sounds normal, soft and non-tender without masses, organomegaly or hernias noted.  No guarding or rebound. No flank tenderness to percussion.  Vascular : all pulses equal ; no bruits present.  Skin:Warm & dry.  Intact without suspicious lesions or rashes ; no tenting or jaundice   Lymphatic: No lymphadenopathy is noted about the head, neck, axilla.   Neuro: Strength, tone & DTRs normal.     Assessment & Plan:  #1 GERD with esophagitis #2 PMH diverticulosis; clinically no diverticulitis present #3 chest congestion w/o active bacterial bronchitis. Symptoms may be related to #1   Plan: Antireflux measures  Zantac 150 mg every 12 hours  GI referral if symptoms persist

## 2015-01-29 ENCOUNTER — Encounter: Payer: Self-pay | Admitting: Internal Medicine

## 2015-01-29 LAB — HEPATITIS C ANTIBODY: HCV Ab: REACTIVE — AB

## 2015-01-30 LAB — HEPATITIS C RNA QUANTITATIVE: HCV Quantitative: NOT DETECTED IU/mL (ref <15)

## 2015-03-06 ENCOUNTER — Encounter: Payer: Self-pay | Admitting: Internal Medicine

## 2015-03-09 ENCOUNTER — Other Ambulatory Visit: Payer: Self-pay | Admitting: Internal Medicine

## 2015-03-09 DIAGNOSIS — K2100 Gastro-esophageal reflux disease with esophagitis, without bleeding: Secondary | ICD-10-CM

## 2015-03-09 DIAGNOSIS — K219 Gastro-esophageal reflux disease without esophagitis: Secondary | ICD-10-CM | POA: Insufficient documentation

## 2015-03-10 ENCOUNTER — Telehealth: Payer: Self-pay | Admitting: Gastroenterology

## 2015-03-10 NOTE — Telephone Encounter (Signed)
Yes accept

## 2015-03-10 NOTE — Telephone Encounter (Signed)
Pt has been referred by his PCP for acid reflux. Patient states he would like to continue his care with Dr. Fuller Plan now that Dr. Deatra Ina isn't here. Will you accept?

## 2015-03-16 ENCOUNTER — Encounter: Payer: Self-pay | Admitting: Gastroenterology

## 2015-03-16 NOTE — Telephone Encounter (Signed)
Patient is scheduled with Dr. Fuller Plan on 05-18-15 @ 2:00pm

## 2015-03-26 ENCOUNTER — Encounter: Payer: Self-pay | Admitting: Internal Medicine

## 2015-03-31 ENCOUNTER — Encounter: Payer: Self-pay | Admitting: Internal Medicine

## 2015-04-02 ENCOUNTER — Ambulatory Visit: Payer: BLUE CROSS/BLUE SHIELD

## 2015-05-18 ENCOUNTER — Encounter: Payer: Self-pay | Admitting: Gastroenterology

## 2015-05-18 ENCOUNTER — Ambulatory Visit (INDEPENDENT_AMBULATORY_CARE_PROVIDER_SITE_OTHER): Payer: BLUE CROSS/BLUE SHIELD | Admitting: Gastroenterology

## 2015-05-18 VITALS — BP 98/70 | HR 76 | Ht 69.75 in | Wt 189.2 lb

## 2015-05-18 DIAGNOSIS — K219 Gastro-esophageal reflux disease without esophagitis: Secondary | ICD-10-CM

## 2015-05-18 MED ORDER — PANTOPRAZOLE SODIUM 40 MG PO TBEC: 40.0000 mg | DELAYED_RELEASE_TABLET | Freq: Every day | ORAL | Status: DC

## 2015-05-18 NOTE — Patient Instructions (Signed)
We have sent the following medications to your pharmacy for you to pick up at your convenience:Protonix.   Patient advised to avoid spicy, acidic, citrus, chocolate, mints, fruit and fruit juices.  Limit the intake of caffeine, alcohol and Soda.  Don't exercise too soon after eating.  Don't lie down within 3-4 hours of eating.  Elevate the head of your bed.  Please call back if your symptoms are not under control.   Thank you for choosing me and [facility].  Pricilla Riffle. Dagoberto Ligas., MD., [facility]  cc: Lutricia Feil, MD

## 2015-05-18 NOTE — Progress Notes (Signed)
    History of Present Illness: This is a 60 year old male former patient of Dr. Kelby Fam complaining of heartburn and belching for several months. He was initially placed on ranitidine by Dr. Linna Darner which did not adequately control his symptoms. His new PCP is Dr. Brigitte Pulse who prescribed pantoprazole 40 mg twice daily about 6 weeks ago and he has had substantial improvement in symptoms. His heartburn is resolved and he still has occasional belching but feels much better. Colonoscopy performed by Dr. Deatra Ina in May 2015 showed mild sigmoid colon diverticulosis and was otherwise normal. Denies weight loss, abdominal pain, constipation, diarrhea, change in stool caliber, melena, hematochezia, nausea, vomiting, dysphagia, chest pain.  Current Medications, Allergies, Past Medical History, Past Surgical History, Family History and Social History were reviewed in [employer] record.  Physical Exam: General: Well developed, well nourished, no acute distress Head: Normocephalic and atraumatic Eyes:  sclerae anicteric, EOMI Ears: Normal auditory acuity Mouth: No deformity or lesions Lungs: Clear throughout to auscultation Heart: Regular rate and rhythm; no murmurs, rubs or bruits Abdomen: Soft, non tender and non distended. No masses, hepatosplenomegaly or hernias noted. Normal Bowel sounds Musculoskeletal: Symmetrical with no gross deformities  Pulses:  Normal pulses noted Extremities: No clubbing, cyanosis, edema or deformities noted Neurological: Alert oriented x 4, grossly nonfocal Psychological:  Alert and cooperative. Normal mood and affect  Assessment and Recommendations:  1. GERD. Continue pantoprazole 40 mg every morning. Follow standard antireflux measures. Gas-X 4 times a day when necessary for belching and gas. If his symptoms do not continue to improve consider EGD for further evaluation. REV in 6 months and prn.  2. CRC screening, average risk. 10 year interval  colonoscopy recommended in May 2025.

## 2015-06-10 ENCOUNTER — Encounter: Payer: Self-pay | Admitting: Gastroenterology

## 2015-06-17 ENCOUNTER — Ambulatory Visit (AMBULATORY_SURGERY_CENTER): Payer: Self-pay

## 2015-06-17 VITALS — Ht 70.0 in | Wt 188.0 lb

## 2015-06-17 DIAGNOSIS — K219 Gastro-esophageal reflux disease without esophagitis: Secondary | ICD-10-CM

## 2015-06-17 NOTE — Progress Notes (Signed)
No egg or soy allergies Not on home 02 No previous anesthesia complications No diet or weight loss meds 

## 2015-06-22 ENCOUNTER — Encounter: Payer: Self-pay | Admitting: Gastroenterology

## 2015-06-22 ENCOUNTER — Ambulatory Visit (AMBULATORY_SURGERY_CENTER): Payer: BLUE CROSS/BLUE SHIELD | Admitting: Gastroenterology

## 2015-06-22 VITALS — BP 116/85 | HR 61 | Temp 96.8°F | Resp 10 | Ht 70.0 in | Wt 188.0 lb

## 2015-06-22 DIAGNOSIS — K219 Gastro-esophageal reflux disease without esophagitis: Secondary | ICD-10-CM | POA: Diagnosis not present

## 2015-06-22 MED ORDER — SODIUM CHLORIDE 0.9 % IV SOLN: 500.0000 mL | INTRAVENOUS | Status: DC

## 2015-06-22 NOTE — Progress Notes (Signed)
Report to PACU, RN, vss, BBS= Clear.  

## 2015-06-22 NOTE — Patient Instructions (Signed)
GERD handout given. Resume current medications. Thank you!!  YOU HAD AN ENDOSCOPIC PROCEDURE TODAY AT [facility]:   Refer to the procedure report that was given to you for any specific questions about what was found during the examination.  If the procedure report does not answer your questions, please call your gastroenterologist to clarify.  If you requested that your care partner not be given the details of your procedure findings, then the procedure report has been included in a sealed envelope for you to review at your convenience later.  YOU SHOULD EXPECT: Some feelings of bloating in the abdomen. Passage of more gas than usual.  Walking can help get rid of the air that was put into your GI tract during the procedure and reduce the bloating. If you had a lower endoscopy (such as a colonoscopy or flexible sigmoidoscopy) you may notice spotting of blood in your stool or on the toilet paper. If you underwent a bowel prep for your procedure, you may not have a normal bowel movement for a few days.  Please Note:  You might notice some irritation and congestion in your nose or some drainage.  This is from the oxygen used during your procedure.  There is no need for concern and it should clear up in a day or so.  SYMPTOMS TO REPORT IMMEDIATELY:   Following upper endoscopy (EGD)  Vomiting of blood or coffee ground material  New chest pain or pain under the shoulder blades  Painful or persistently difficult swallowing  New shortness of breath  Fever of 100F or higher  Black, tarry-looking stools  For urgent or emergent issues, a gastroenterologist can be reached at any hour by calling [phone].   DIET: Your first meal following the procedure should be a small meal and then it is ok to progress to your normal diet. Heavy or fried foods are harder to digest and may make you feel nauseous or bloated.  Likewise, meals heavy in dairy and vegetables can increase bloating.  Drink  plenty of fluids but you should avoid alcoholic beverages for 24 hours.  ACTIVITY:  You should plan to take it easy for the rest of today and you should NOT DRIVE or use heavy machinery until tomorrow (because of the sedation medicines used during the test).    FOLLOW UP: Our staff will call the number listed on your records the next business day following your procedure to check on you and address any questions or concerns that you may have regarding the information given to you following your procedure. If we do not reach you, we will leave a message.  However, if you are feeling well and you are not experiencing any problems, there is no need to return our call.  We will assume that you have returned to your regular daily activities without incident.  If any biopsies were taken you will be contacted by phone or by letter within the next 1-3 weeks.  Please call us at [phone] if you have not heard about the biopsies in 3 weeks.    SIGNATURES/CONFIDENTIALITY: You and/or your care partner have signed paperwork which will be entered into your electronic medical record.  These signatures attest to the fact that that the information above on your After Visit Summary has been reviewed and is understood.  Full responsibility of the confidentiality of this discharge information lies with you and/or your care-partner.

## 2015-06-22 NOTE — Op Note (Signed)
[place]  [address] [place], [address]   ENDOSCOPY PROCEDURE REPORT  PATIENT: Jason Vaughn, Jason Vaughn  MR#: [id number] BIRTHDATE: 1955-11-10 , 60  yrs. old GENDER: male ENDOSCOPIST: Ladene Artist, MD, [facility] REFERRED BY:  Janalyn Rouse, M.D. PROCEDURE DATE:  06/22/2015 PROCEDURE:  EGD, diagnostic ASA CLASS:     Class II INDICATIONS:  history of esophageal reflux. MEDICATIONS: Monitored anesthesia care and Propofol 150 mg IV TOPICAL ANESTHETIC: none  DESCRIPTION OF PROCEDURE: After the risks benefits and alternatives of the procedure were thoroughly explained, informed consent was obtained.  The LB [id number] endoscope was introduced through the mouth and advanced to the second portion of the duodenum , Without limitations.  The instrument was slowly withdrawn as the mucosa was fully examined.    ESOPHAGUS: There was LA Class A esophagitis (One or more mucosal breaks < 5 mm in maximal length) noted.  The esophagus otherwise appeared normal. STOMACH: The mucosa and folds of the stomach appeared normal. DUODENUM: The duodenal mucosa showed no abnormalities in the bulb and 2nd part of the duodenum.  Retroflexed views revealed no abnormalities.   The scope was then withdrawn from the patient and the procedure completed.  COMPLICATIONS: There were no immediate complications.  ENDOSCOPIC IMPRESSION: 1.   LA Class A esophagitis 2.   The EGD otherwise appeared normal  RECOMMENDATIONS: 1.  Anti-reflux regimen 2.  Continue PPI qam  eSigned:  Ladene Artist, MD, [facility] 06/22/2015 10:27 AM

## 2015-06-23 ENCOUNTER — Telehealth: Payer: Self-pay

## 2015-06-23 NOTE — Telephone Encounter (Signed)
  Follow up Call-  Call back number 06/22/2015 08/26/2013  Post procedure Call Back phone  # [phone]  Permission to leave phone message Yes Yes    Patient was called for follow up after his procedure on 06/22/2015. No answer at the number given for follow up phone call. A message was left on his answering machine.

## 2015-07-27 ENCOUNTER — Encounter: Payer: Self-pay | Admitting: Podiatry

## 2015-07-27 ENCOUNTER — Ambulatory Visit (INDEPENDENT_AMBULATORY_CARE_PROVIDER_SITE_OTHER): Payer: BLUE CROSS/BLUE SHIELD | Admitting: Podiatry

## 2015-07-27 VITALS — BP 111/67 | HR 75 | Resp 16

## 2015-07-27 DIAGNOSIS — S90121A Contusion of right lesser toe(s) without damage to nail, initial encounter: Secondary | ICD-10-CM

## 2015-07-28 NOTE — Progress Notes (Signed)
Subjective:     Patient ID: Jason Vaughn, male   DOB: 10-21-1955, 60 y.o.   MRN: [id number]  HPI patient presents stating I traumatized my right little toe and am worried I broke it or I may affect the blood supply. It does not really been sore but it severely discolored   Review of Systems     Objective:   Physical Exam Neurovascular status found to be intact muscle strength was adequate with significant discoloration of the right fifth digit with the entire toe being a darkish color. There is Fill time noted distally and minimal discomfort when palpated    Assessment:     Significant trauma to the right fifth toe with possibility for fracture or vascular compromise    Plan:     X-rays reviewed and allow patient begin soaking therapy for this and we will evaluated and decide whether or not anything else is appropriate. Patient will be seen back for [place] to recheck  X-ray report negative for signs of fracture appears to be soft tissue contusion

## 2015-07-29 ENCOUNTER — Ambulatory Visit: Payer: BLUE CROSS/BLUE SHIELD | Admitting: Podiatry

## 2015-10-09 DIAGNOSIS — H0011 Chalazion right upper eyelid: Secondary | ICD-10-CM | POA: Diagnosis not present

## 2015-11-05 DIAGNOSIS — L259 Unspecified contact dermatitis, unspecified cause: Secondary | ICD-10-CM | POA: Diagnosis not present

## 2015-11-09 DIAGNOSIS — B360 Pityriasis versicolor: Secondary | ICD-10-CM | POA: Diagnosis not present

## 2015-12-04 DIAGNOSIS — N401 Enlarged prostate with lower urinary tract symptoms: Secondary | ICD-10-CM | POA: Diagnosis not present

## 2016-01-13 DIAGNOSIS — R42 Dizziness and giddiness: Secondary | ICD-10-CM | POA: Diagnosis not present

## 2016-01-13 DIAGNOSIS — Z6824 Body mass index (BMI) 24.0-24.9, adult: Secondary | ICD-10-CM | POA: Diagnosis not present

## 2016-01-13 DIAGNOSIS — H8112 Benign paroxysmal vertigo, left ear: Secondary | ICD-10-CM | POA: Diagnosis not present

## 2016-01-13 DIAGNOSIS — H6123 Impacted cerumen, bilateral: Secondary | ICD-10-CM | POA: Diagnosis not present

## 2016-01-14 DIAGNOSIS — R42 Dizziness and giddiness: Secondary | ICD-10-CM | POA: Diagnosis not present

## 2016-02-02 DIAGNOSIS — L810 Postinflammatory hyperpigmentation: Secondary | ICD-10-CM | POA: Diagnosis not present

## 2016-02-02 DIAGNOSIS — D225 Melanocytic nevi of trunk: Secondary | ICD-10-CM | POA: Diagnosis not present

## 2016-02-02 DIAGNOSIS — D1801 Hemangioma of skin and subcutaneous tissue: Secondary | ICD-10-CM | POA: Diagnosis not present

## 2016-02-02 DIAGNOSIS — L821 Other seborrheic keratosis: Secondary | ICD-10-CM | POA: Diagnosis not present

## 2016-02-12 DIAGNOSIS — L820 Inflamed seborrheic keratosis: Secondary | ICD-10-CM | POA: Diagnosis not present

## 2016-03-23 DIAGNOSIS — Z23 Encounter for immunization: Secondary | ICD-10-CM | POA: Diagnosis not present

## 2016-03-30 DIAGNOSIS — D3131 Benign neoplasm of right choroid: Secondary | ICD-10-CM | POA: Diagnosis not present

## 2016-03-30 DIAGNOSIS — H5211 Myopia, right eye: Secondary | ICD-10-CM | POA: Diagnosis not present

## 2016-04-20 DIAGNOSIS — H5210 Myopia, unspecified eye: Secondary | ICD-10-CM | POA: Diagnosis not present

## 2016-06-17 DIAGNOSIS — E784 Other hyperlipidemia: Secondary | ICD-10-CM | POA: Diagnosis not present

## 2016-06-17 DIAGNOSIS — Z0000 Encounter for general adult medical examination without abnormal findings: Secondary | ICD-10-CM | POA: Diagnosis not present

## 2016-06-24 DIAGNOSIS — Z87448 Personal history of other diseases of urinary system: Secondary | ICD-10-CM | POA: Diagnosis not present

## 2016-06-24 DIAGNOSIS — Z1389 Encounter for screening for other disorder: Secondary | ICD-10-CM | POA: Diagnosis not present

## 2016-06-24 DIAGNOSIS — E784 Other hyperlipidemia: Secondary | ICD-10-CM | POA: Diagnosis not present

## 2016-06-24 DIAGNOSIS — R7301 Impaired fasting glucose: Secondary | ICD-10-CM | POA: Diagnosis not present

## 2016-06-24 DIAGNOSIS — Z0000 Encounter for general adult medical examination without abnormal findings: Secondary | ICD-10-CM | POA: Diagnosis not present

## 2016-06-24 DIAGNOSIS — N400 Enlarged prostate without lower urinary tract symptoms: Secondary | ICD-10-CM | POA: Diagnosis not present

## 2016-06-27 DIAGNOSIS — N401 Enlarged prostate with lower urinary tract symptoms: Secondary | ICD-10-CM | POA: Diagnosis not present

## 2016-06-29 DIAGNOSIS — Z1212 Encounter for screening for malignant neoplasm of rectum: Secondary | ICD-10-CM | POA: Diagnosis not present

## 2016-07-05 DIAGNOSIS — Z91030 Bee allergy status: Secondary | ICD-10-CM | POA: Diagnosis not present

## 2016-07-05 DIAGNOSIS — Z7982 Long term (current) use of aspirin: Secondary | ICD-10-CM | POA: Diagnosis not present

## 2016-07-05 DIAGNOSIS — Z9889 Other specified postprocedural states: Secondary | ICD-10-CM | POA: Diagnosis not present

## 2016-07-05 DIAGNOSIS — Z79899 Other long term (current) drug therapy: Secondary | ICD-10-CM | POA: Diagnosis not present

## 2016-07-05 DIAGNOSIS — Z87891 Personal history of nicotine dependence: Secondary | ICD-10-CM | POA: Diagnosis not present

## 2016-07-05 DIAGNOSIS — E785 Hyperlipidemia, unspecified: Secondary | ICD-10-CM | POA: Diagnosis not present

## 2016-07-05 DIAGNOSIS — Z7951 Long term (current) use of inhaled steroids: Secondary | ICD-10-CM | POA: Diagnosis not present

## 2016-07-05 DIAGNOSIS — N401 Enlarged prostate with lower urinary tract symptoms: Secondary | ICD-10-CM | POA: Diagnosis not present

## 2016-07-11 DIAGNOSIS — R338 Other retention of urine: Secondary | ICD-10-CM | POA: Diagnosis not present

## 2016-07-21 DIAGNOSIS — R338 Other retention of urine: Secondary | ICD-10-CM | POA: Diagnosis not present

## 2016-07-21 DIAGNOSIS — N401 Enlarged prostate with lower urinary tract symptoms: Secondary | ICD-10-CM | POA: Diagnosis not present

## 2016-07-29 DIAGNOSIS — N138 Other obstructive and reflux uropathy: Secondary | ICD-10-CM | POA: Diagnosis not present

## 2016-07-29 DIAGNOSIS — N401 Enlarged prostate with lower urinary tract symptoms: Secondary | ICD-10-CM | POA: Diagnosis not present

## 2016-08-08 DIAGNOSIS — N401 Enlarged prostate with lower urinary tract symptoms: Secondary | ICD-10-CM | POA: Diagnosis not present

## 2016-08-08 DIAGNOSIS — N138 Other obstructive and reflux uropathy: Secondary | ICD-10-CM | POA: Diagnosis not present

## 2016-11-11 DIAGNOSIS — N138 Other obstructive and reflux uropathy: Secondary | ICD-10-CM | POA: Diagnosis not present

## 2016-11-11 DIAGNOSIS — N401 Enlarged prostate with lower urinary tract symptoms: Secondary | ICD-10-CM | POA: Diagnosis not present

## 2017-01-26 DIAGNOSIS — D225 Melanocytic nevi of trunk: Secondary | ICD-10-CM | POA: Diagnosis not present

## 2017-01-26 DIAGNOSIS — L821 Other seborrheic keratosis: Secondary | ICD-10-CM | POA: Diagnosis not present

## 2017-01-26 DIAGNOSIS — D2272 Melanocytic nevi of left lower limb, including hip: Secondary | ICD-10-CM | POA: Diagnosis not present

## 2017-01-26 DIAGNOSIS — L812 Freckles: Secondary | ICD-10-CM | POA: Diagnosis not present

## 2017-03-16 DIAGNOSIS — N401 Enlarged prostate with lower urinary tract symptoms: Secondary | ICD-10-CM | POA: Diagnosis not present

## 2017-03-16 DIAGNOSIS — N138 Other obstructive and reflux uropathy: Secondary | ICD-10-CM | POA: Diagnosis not present

## 2017-05-04 DIAGNOSIS — D3131 Benign neoplasm of right choroid: Secondary | ICD-10-CM | POA: Diagnosis not present

## 2017-05-04 DIAGNOSIS — H5211 Myopia, right eye: Secondary | ICD-10-CM | POA: Diagnosis not present

## 2017-06-06 DIAGNOSIS — N403 Nodular prostate with lower urinary tract symptoms: Secondary | ICD-10-CM | POA: Diagnosis not present

## 2017-06-06 DIAGNOSIS — Z7982 Long term (current) use of aspirin: Secondary | ICD-10-CM | POA: Diagnosis not present

## 2017-06-06 DIAGNOSIS — Z91048 Other nonmedicinal substance allergy status: Secondary | ICD-10-CM | POA: Diagnosis not present

## 2017-06-06 DIAGNOSIS — Z91030 Bee allergy status: Secondary | ICD-10-CM | POA: Diagnosis not present

## 2017-06-06 DIAGNOSIS — Z7951 Long term (current) use of inhaled steroids: Secondary | ICD-10-CM | POA: Diagnosis not present

## 2017-06-06 DIAGNOSIS — N138 Other obstructive and reflux uropathy: Secondary | ICD-10-CM | POA: Diagnosis not present

## 2017-06-06 DIAGNOSIS — M1990 Unspecified osteoarthritis, unspecified site: Secondary | ICD-10-CM | POA: Diagnosis not present

## 2017-06-06 DIAGNOSIS — Z79899 Other long term (current) drug therapy: Secondary | ICD-10-CM | POA: Diagnosis not present

## 2017-06-06 DIAGNOSIS — Z791 Long term (current) use of non-steroidal anti-inflammatories (NSAID): Secondary | ICD-10-CM | POA: Diagnosis not present

## 2017-06-06 DIAGNOSIS — Z87891 Personal history of nicotine dependence: Secondary | ICD-10-CM | POA: Diagnosis not present

## 2017-06-06 DIAGNOSIS — E782 Mixed hyperlipidemia: Secondary | ICD-10-CM | POA: Diagnosis not present

## 2017-06-06 DIAGNOSIS — N401 Enlarged prostate with lower urinary tract symptoms: Secondary | ICD-10-CM | POA: Diagnosis not present

## 2017-06-07 DIAGNOSIS — Z91048 Other nonmedicinal substance allergy status: Secondary | ICD-10-CM | POA: Diagnosis not present

## 2017-06-07 DIAGNOSIS — Z87891 Personal history of nicotine dependence: Secondary | ICD-10-CM | POA: Diagnosis not present

## 2017-06-07 DIAGNOSIS — Z791 Long term (current) use of non-steroidal anti-inflammatories (NSAID): Secondary | ICD-10-CM | POA: Diagnosis not present

## 2017-06-07 DIAGNOSIS — M1990 Unspecified osteoarthritis, unspecified site: Secondary | ICD-10-CM | POA: Diagnosis not present

## 2017-06-07 DIAGNOSIS — Z79899 Other long term (current) drug therapy: Secondary | ICD-10-CM | POA: Diagnosis not present

## 2017-06-07 DIAGNOSIS — N401 Enlarged prostate with lower urinary tract symptoms: Secondary | ICD-10-CM | POA: Diagnosis not present

## 2017-06-07 DIAGNOSIS — Z7982 Long term (current) use of aspirin: Secondary | ICD-10-CM | POA: Diagnosis not present

## 2017-06-07 DIAGNOSIS — Z7951 Long term (current) use of inhaled steroids: Secondary | ICD-10-CM | POA: Diagnosis not present

## 2017-06-07 DIAGNOSIS — Z91030 Bee allergy status: Secondary | ICD-10-CM | POA: Diagnosis not present

## 2017-06-07 DIAGNOSIS — E782 Mixed hyperlipidemia: Secondary | ICD-10-CM | POA: Diagnosis not present

## 2017-06-23 DIAGNOSIS — Z125 Encounter for screening for malignant neoplasm of prostate: Secondary | ICD-10-CM | POA: Diagnosis not present

## 2017-06-23 DIAGNOSIS — R7301 Impaired fasting glucose: Secondary | ICD-10-CM | POA: Diagnosis not present

## 2017-06-23 DIAGNOSIS — R82998 Other abnormal findings in urine: Secondary | ICD-10-CM | POA: Diagnosis not present

## 2017-06-23 DIAGNOSIS — E7849 Other hyperlipidemia: Secondary | ICD-10-CM | POA: Diagnosis not present

## 2017-06-23 DIAGNOSIS — Z87438 Personal history of other diseases of male genital organs: Secondary | ICD-10-CM | POA: Diagnosis not present

## 2017-06-30 DIAGNOSIS — E7849 Other hyperlipidemia: Secondary | ICD-10-CM | POA: Diagnosis not present

## 2017-06-30 DIAGNOSIS — R7301 Impaired fasting glucose: Secondary | ICD-10-CM | POA: Diagnosis not present

## 2017-06-30 DIAGNOSIS — N400 Enlarged prostate without lower urinary tract symptoms: Secondary | ICD-10-CM | POA: Diagnosis not present

## 2017-06-30 DIAGNOSIS — Z0000 Encounter for general adult medical examination without abnormal findings: Secondary | ICD-10-CM | POA: Diagnosis not present

## 2017-06-30 DIAGNOSIS — R3121 Asymptomatic microscopic hematuria: Secondary | ICD-10-CM | POA: Diagnosis not present

## 2017-06-30 DIAGNOSIS — Z1331 Encounter for screening for depression: Secondary | ICD-10-CM | POA: Diagnosis not present

## 2017-07-05 DIAGNOSIS — Z1212 Encounter for screening for malignant neoplasm of rectum: Secondary | ICD-10-CM | POA: Diagnosis not present

## 2017-11-27 DIAGNOSIS — M6281 Muscle weakness (generalized): Secondary | ICD-10-CM | POA: Diagnosis not present

## 2017-11-27 DIAGNOSIS — M7701 Medial epicondylitis, right elbow: Secondary | ICD-10-CM | POA: Diagnosis not present

## 2017-11-27 DIAGNOSIS — M25521 Pain in right elbow: Secondary | ICD-10-CM | POA: Diagnosis not present

## 2017-11-29 DIAGNOSIS — M6281 Muscle weakness (generalized): Secondary | ICD-10-CM | POA: Diagnosis not present

## 2017-11-29 DIAGNOSIS — M25521 Pain in right elbow: Secondary | ICD-10-CM | POA: Diagnosis not present

## 2017-11-29 DIAGNOSIS — M7701 Medial epicondylitis, right elbow: Secondary | ICD-10-CM | POA: Diagnosis not present

## 2017-12-04 DIAGNOSIS — M6281 Muscle weakness (generalized): Secondary | ICD-10-CM | POA: Diagnosis not present

## 2017-12-04 DIAGNOSIS — M25521 Pain in right elbow: Secondary | ICD-10-CM | POA: Diagnosis not present

## 2017-12-04 DIAGNOSIS — M7701 Medial epicondylitis, right elbow: Secondary | ICD-10-CM | POA: Diagnosis not present

## 2018-01-30 DIAGNOSIS — D225 Melanocytic nevi of trunk: Secondary | ICD-10-CM | POA: Diagnosis not present

## 2018-01-30 DIAGNOSIS — Z85828 Personal history of other malignant neoplasm of skin: Secondary | ICD-10-CM | POA: Diagnosis not present

## 2018-01-30 DIAGNOSIS — L812 Freckles: Secondary | ICD-10-CM | POA: Diagnosis not present

## 2018-01-30 DIAGNOSIS — L821 Other seborrheic keratosis: Secondary | ICD-10-CM | POA: Diagnosis not present

## 2018-02-20 DIAGNOSIS — R030 Elevated blood-pressure reading, without diagnosis of hypertension: Secondary | ICD-10-CM | POA: Diagnosis not present

## 2018-02-20 DIAGNOSIS — M5440 Lumbago with sciatica, unspecified side: Secondary | ICD-10-CM | POA: Diagnosis not present

## 2018-02-26 ENCOUNTER — Other Ambulatory Visit: Payer: Self-pay | Admitting: Neurosurgery

## 2018-02-27 ENCOUNTER — Other Ambulatory Visit: Payer: Self-pay | Admitting: Neurosurgery

## 2018-02-27 DIAGNOSIS — M5440 Lumbago with sciatica, unspecified side: Secondary | ICD-10-CM

## 2018-03-12 ENCOUNTER — Ambulatory Visit
Admission: RE | Admit: 2018-03-12 | Discharge: 2018-03-12 | Disposition: A | Discharge status: Home / Self Care | Payer: BLUE CROSS/BLUE SHIELD | Source: Ambulatory Visit | Attending: Neurosurgery | Admitting: Neurosurgery

## 2018-03-12 DIAGNOSIS — M5440 Lumbago with sciatica, unspecified side: Secondary | ICD-10-CM

## 2018-03-12 DIAGNOSIS — M48061 Spinal stenosis, lumbar region without neurogenic claudication: Secondary | ICD-10-CM | POA: Diagnosis not present

## 2018-03-29 DIAGNOSIS — M5440 Lumbago with sciatica, unspecified side: Secondary | ICD-10-CM | POA: Diagnosis not present

## 2018-04-04 DIAGNOSIS — M5440 Lumbago with sciatica, unspecified side: Secondary | ICD-10-CM | POA: Diagnosis not present

## 2018-04-18 HISTORY — PX: PROSTATE SURGERY: SHX751

## 2018-06-22 DIAGNOSIS — D3142 Benign neoplasm of left ciliary body: Secondary | ICD-10-CM | POA: Diagnosis not present

## 2018-06-22 DIAGNOSIS — D3131 Benign neoplasm of right choroid: Secondary | ICD-10-CM | POA: Diagnosis not present

## 2018-06-22 DIAGNOSIS — H5211 Myopia, right eye: Secondary | ICD-10-CM | POA: Diagnosis not present

## 2018-07-03 DIAGNOSIS — R7301 Impaired fasting glucose: Secondary | ICD-10-CM | POA: Diagnosis not present

## 2018-07-03 DIAGNOSIS — Z125 Encounter for screening for malignant neoplasm of prostate: Secondary | ICD-10-CM | POA: Diagnosis not present

## 2018-07-03 DIAGNOSIS — E7849 Other hyperlipidemia: Secondary | ICD-10-CM | POA: Diagnosis not present

## 2018-07-11 DIAGNOSIS — R1032 Left lower quadrant pain: Secondary | ICD-10-CM | POA: Diagnosis not present

## 2018-07-11 DIAGNOSIS — N400 Enlarged prostate without lower urinary tract symptoms: Secondary | ICD-10-CM | POA: Diagnosis not present

## 2018-07-11 DIAGNOSIS — K219 Gastro-esophageal reflux disease without esophagitis: Secondary | ICD-10-CM | POA: Diagnosis not present

## 2018-07-11 DIAGNOSIS — Z0000 Encounter for general adult medical examination without abnormal findings: Secondary | ICD-10-CM | POA: Diagnosis not present

## 2018-07-11 DIAGNOSIS — R82998 Other abnormal findings in urine: Secondary | ICD-10-CM | POA: Diagnosis not present

## 2018-07-11 DIAGNOSIS — Z1331 Encounter for screening for depression: Secondary | ICD-10-CM | POA: Diagnosis not present

## 2018-07-11 DIAGNOSIS — Z125 Encounter for screening for malignant neoplasm of prostate: Secondary | ICD-10-CM | POA: Diagnosis not present

## 2018-07-11 DIAGNOSIS — R7301 Impaired fasting glucose: Secondary | ICD-10-CM | POA: Diagnosis not present

## 2018-12-25 DIAGNOSIS — D485 Neoplasm of uncertain behavior of skin: Secondary | ICD-10-CM | POA: Diagnosis not present

## 2018-12-25 DIAGNOSIS — Z85828 Personal history of other malignant neoplasm of skin: Secondary | ICD-10-CM | POA: Diagnosis not present

## 2018-12-25 DIAGNOSIS — D2362 Other benign neoplasm of skin of left upper limb, including shoulder: Secondary | ICD-10-CM | POA: Diagnosis not present

## 2018-12-25 DIAGNOSIS — R58 Hemorrhage, not elsewhere classified: Secondary | ICD-10-CM | POA: Diagnosis not present

## 2018-12-25 DIAGNOSIS — D225 Melanocytic nevi of trunk: Secondary | ICD-10-CM | POA: Diagnosis not present

## 2018-12-25 DIAGNOSIS — D3612 Benign neoplasm of peripheral nerves and autonomic nervous system, upper limb, including shoulder: Secondary | ICD-10-CM | POA: Diagnosis not present

## 2019-04-09 DIAGNOSIS — M5440 Lumbago with sciatica, unspecified side: Secondary | ICD-10-CM | POA: Diagnosis not present

## 2019-04-09 IMAGING — MR MR LUMBAR SPINE W/O CM
4 of 5 series · 26 of 48 positions shown · non-contrast
Comparison: MRI lumbar spine dated October 06, 2010.

CLINICAL DATA: Two episodes of left low back pain radiating into
the left buttock and hip over the past month and a half.

EXAM:
MRI LUMBAR SPINE WITHOUT CONTRAST
TECHNIQUE: Multiplanar, multisequence MR imaging of the lumbar spine was
performed. No intravenous contrast was administered.

[Series 3: T2 post-contrast · sagittal · 4.0mm · 0.59mm/px · 6 of 17 slices shown]
[im 1/17]
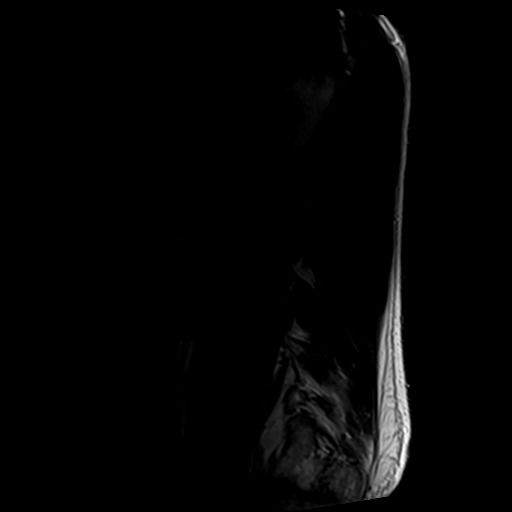
[im 4/17]
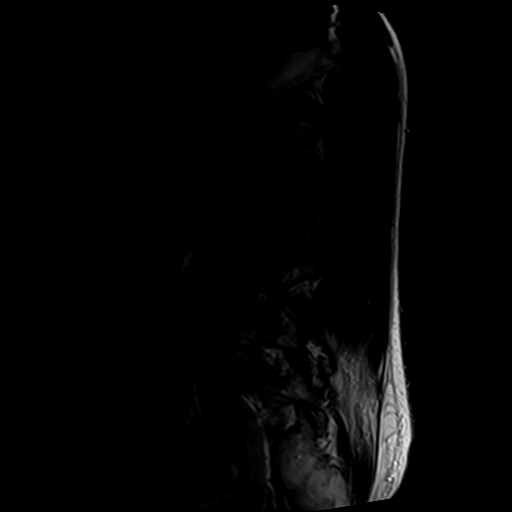
[im 7/17]
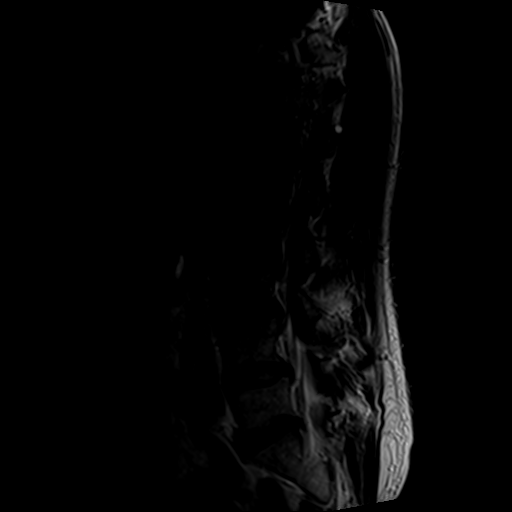
[im 10/17]
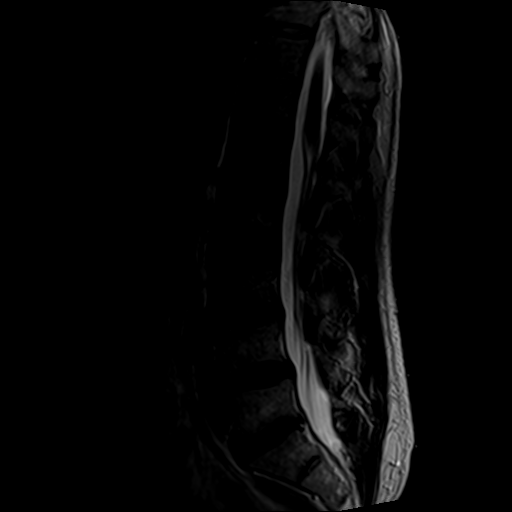
[im 13/17]
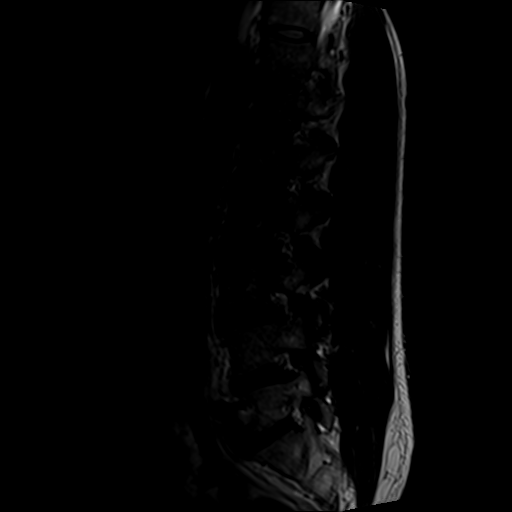
[im 17/17]
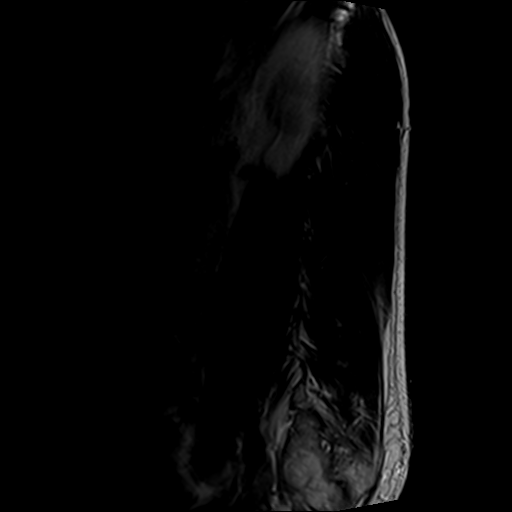

[Series 5: T1 · sagittal · 4.0mm · 0.59mm/px · 6 of 17 slices shown (1 of 2)]
[im 1/17]
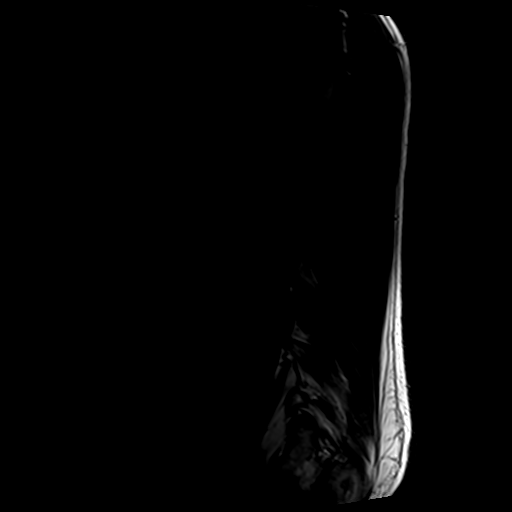
[im 4/17]
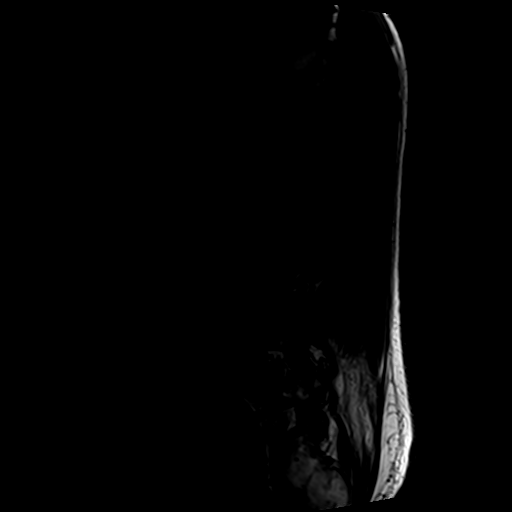
[im 7/17]
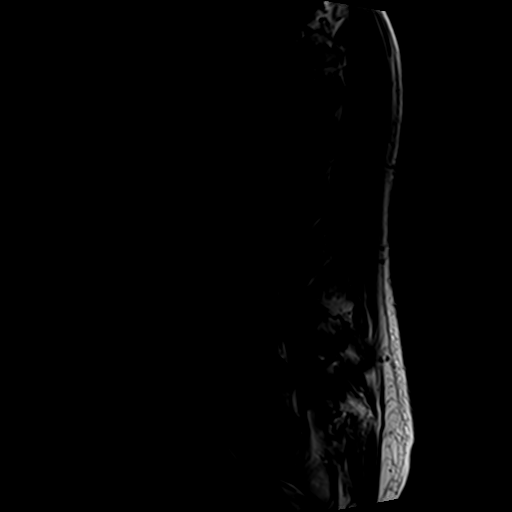
[im 10/17]
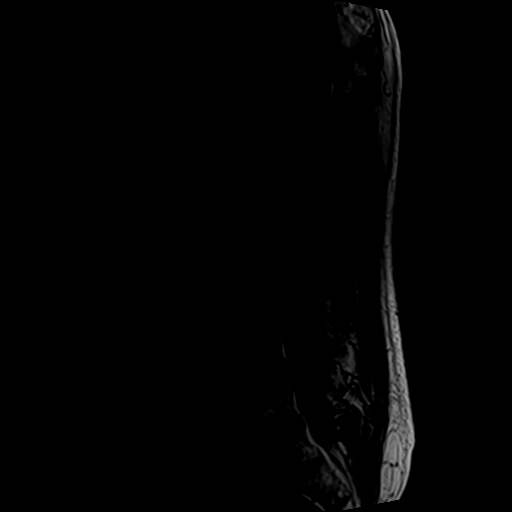
[im 13/17]
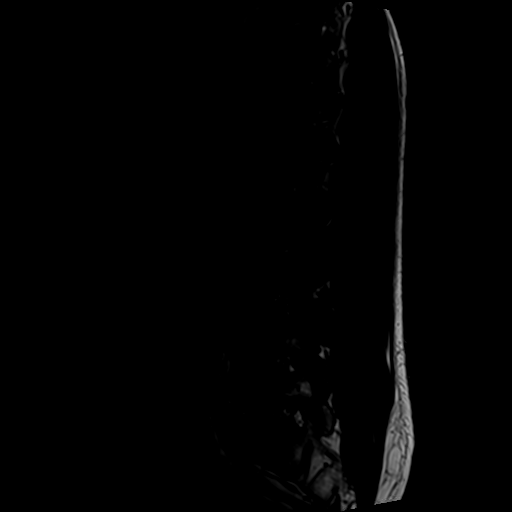
[im 17/17]
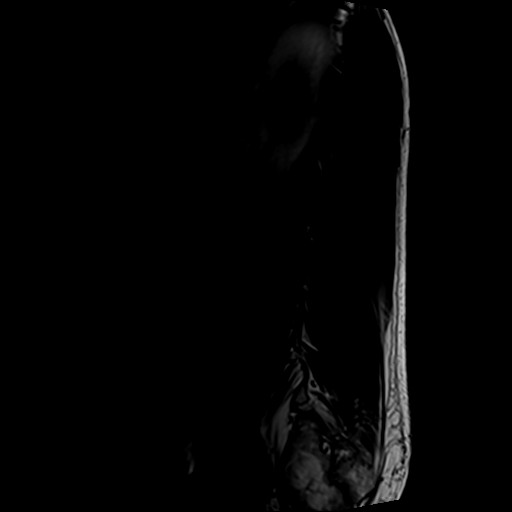

[Series 6: T1 · axial · 4.0mm · 0.35mm/px · z∈[-229,-26]mm · 5 of 43 slices shown (2 of 2)]
[im 1/43]
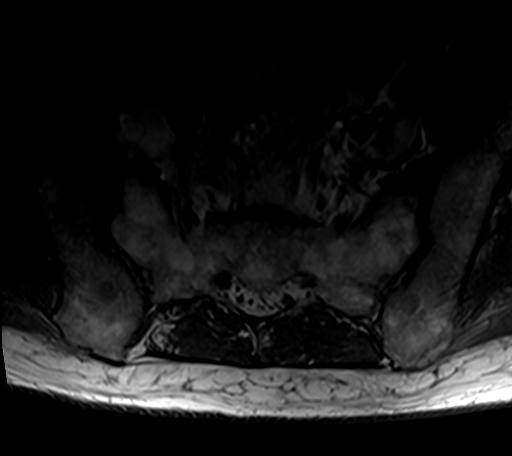
[im 7/43]
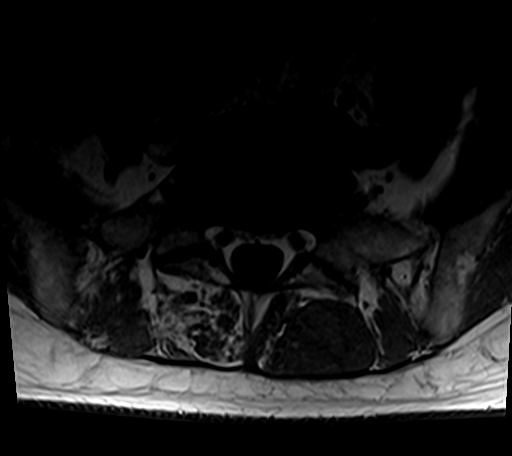
[im 13/43]
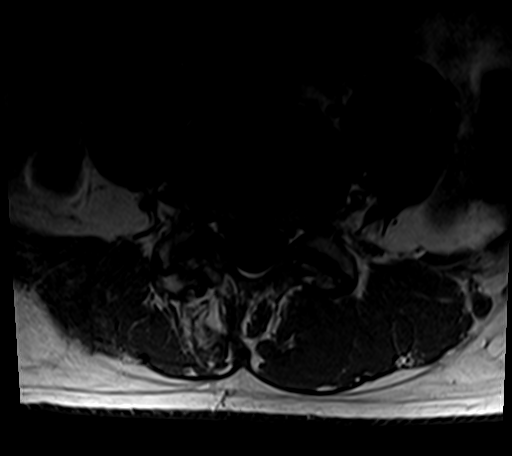
[im 22/43]
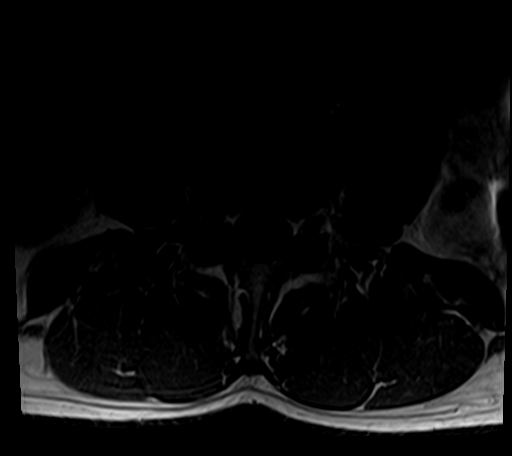
[im 37/43]
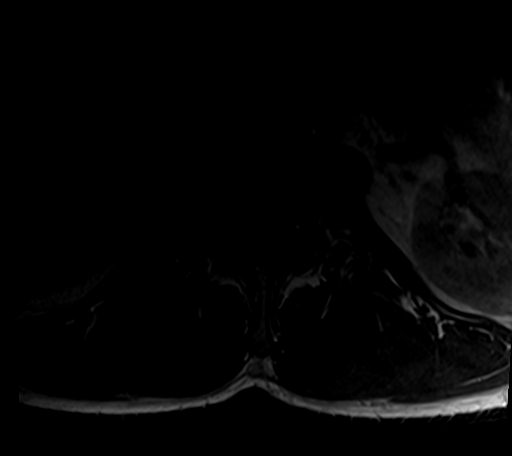

[Series 7: T2 · axial · 4.0mm · 0.70mm/px · z∈[-229,+5]mm · 9 of 43 slices shown]
[im 1/43]
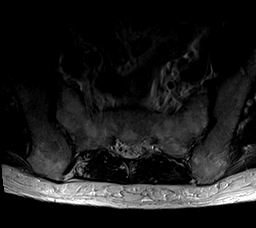
[im 7/43]
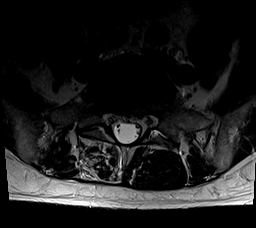
[im 13/43]
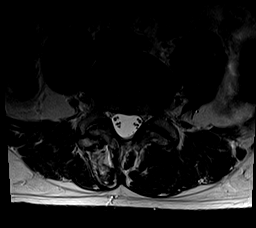
[im 19/43]
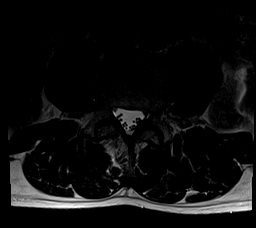
[im 22/43]
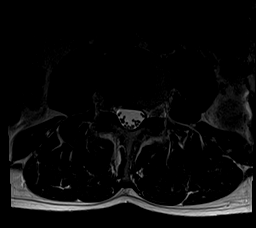
[im 25/43]
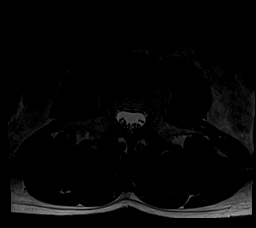
[im 31/43]
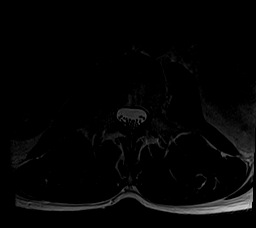
[im 37/43]
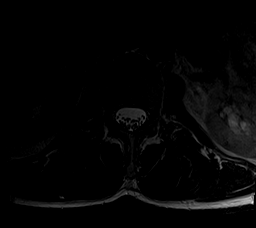
[im 43/43]
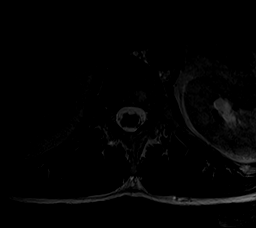

[26 of 48 positions shown; findings below may reference images not displayed]

FINDINGS: Segmentation: Assumed standard. The last well-formed disc space is
designated L5-S1 for the purposes of this report.

Alignment: New trace anterolisthesis at L3-L4 and L4-L5. Unchanged
trace retrolisthesis at L5-S1.

Vertebrae:  No fracture, evidence of discitis, or bone lesion.

Conus medullaris and cauda equina: Conus extends to the L1 level.
Conus and cauda equina appear normal.

Paraspinal and other soft tissues: Atrophy of the right
transversospinalis muscle. Otherwise negative.

Disc levels:

T10-T11 to L2-L3:  Negative.

L3-L4: Progressive mild diffuse disc bulge and mild bilateral facet
arthropathy. New mild right neuroforaminal stenosis. No spinal canal
or left neuroforaminal stenosis.

L4-L5: Progressive mild diffuse disc bulge with unchanged left
foraminal annular fissure. Unchanged mild bilateral facet
arthropathy. New borderline mild bilateral neuroforaminal stenosis.
No spinal canal stenosis.

L5-S1: Unchanged shallow broad-based central disc protrusion and
annular fissure. No stenosis.
IMPRESSION: 1. Mild lower lumbar degenerative disc disease, slightly progressed
at L3-L4 and L4-L5. No high-grade stenosis or nerve root
impingement.

## 2019-04-16 DIAGNOSIS — M5440 Lumbago with sciatica, unspecified side: Secondary | ICD-10-CM | POA: Diagnosis not present

## 2019-04-23 DIAGNOSIS — M5440 Lumbago with sciatica, unspecified side: Secondary | ICD-10-CM | POA: Diagnosis not present

## 2019-04-30 DIAGNOSIS — M5440 Lumbago with sciatica, unspecified side: Secondary | ICD-10-CM | POA: Diagnosis not present

## 2019-05-07 DIAGNOSIS — M5440 Lumbago with sciatica, unspecified side: Secondary | ICD-10-CM | POA: Diagnosis not present

## 2019-05-09 DIAGNOSIS — M5440 Lumbago with sciatica, unspecified side: Secondary | ICD-10-CM | POA: Diagnosis not present

## 2019-05-21 DIAGNOSIS — M5440 Lumbago with sciatica, unspecified side: Secondary | ICD-10-CM | POA: Diagnosis not present

## 2019-05-23 DIAGNOSIS — M5440 Lumbago with sciatica, unspecified side: Secondary | ICD-10-CM | POA: Diagnosis not present

## 2019-05-28 DIAGNOSIS — Z20828 Contact with and (suspected) exposure to other viral communicable diseases: Secondary | ICD-10-CM | POA: Diagnosis not present

## 2019-05-28 DIAGNOSIS — Z03818 Encounter for observation for suspected exposure to other biological agents ruled out: Secondary | ICD-10-CM | POA: Diagnosis not present

## 2019-06-04 DIAGNOSIS — M5440 Lumbago with sciatica, unspecified side: Secondary | ICD-10-CM | POA: Diagnosis not present

## 2019-06-06 DIAGNOSIS — M5440 Lumbago with sciatica, unspecified side: Secondary | ICD-10-CM | POA: Diagnosis not present

## 2019-06-18 DIAGNOSIS — M5440 Lumbago with sciatica, unspecified side: Secondary | ICD-10-CM | POA: Diagnosis not present

## 2019-06-20 DIAGNOSIS — M5440 Lumbago with sciatica, unspecified side: Secondary | ICD-10-CM | POA: Diagnosis not present

## 2019-06-24 DIAGNOSIS — M5440 Lumbago with sciatica, unspecified side: Secondary | ICD-10-CM | POA: Diagnosis not present

## 2019-06-27 DIAGNOSIS — M5440 Lumbago with sciatica, unspecified side: Secondary | ICD-10-CM | POA: Diagnosis not present

## 2019-06-28 DIAGNOSIS — D3142 Benign neoplasm of left ciliary body: Secondary | ICD-10-CM | POA: Diagnosis not present

## 2019-06-28 DIAGNOSIS — H524 Presbyopia: Secondary | ICD-10-CM | POA: Diagnosis not present

## 2019-07-02 DIAGNOSIS — M5440 Lumbago with sciatica, unspecified side: Secondary | ICD-10-CM | POA: Diagnosis not present

## 2019-07-04 DIAGNOSIS — M5440 Lumbago with sciatica, unspecified side: Secondary | ICD-10-CM | POA: Diagnosis not present

## 2019-07-09 DIAGNOSIS — M5440 Lumbago with sciatica, unspecified side: Secondary | ICD-10-CM | POA: Diagnosis not present

## 2019-07-10 DIAGNOSIS — B079 Viral wart, unspecified: Secondary | ICD-10-CM | POA: Diagnosis not present

## 2019-07-10 DIAGNOSIS — B078 Other viral warts: Secondary | ICD-10-CM | POA: Diagnosis not present

## 2019-07-11 DIAGNOSIS — M5440 Lumbago with sciatica, unspecified side: Secondary | ICD-10-CM | POA: Diagnosis not present

## 2019-07-15 DIAGNOSIS — Z20828 Contact with and (suspected) exposure to other viral communicable diseases: Secondary | ICD-10-CM | POA: Diagnosis not present

## 2019-07-15 DIAGNOSIS — Z0000 Encounter for general adult medical examination without abnormal findings: Secondary | ICD-10-CM | POA: Diagnosis not present

## 2019-07-15 DIAGNOSIS — Z125 Encounter for screening for malignant neoplasm of prostate: Secondary | ICD-10-CM | POA: Diagnosis not present

## 2019-07-15 DIAGNOSIS — R7301 Impaired fasting glucose: Secondary | ICD-10-CM | POA: Diagnosis not present

## 2019-07-15 DIAGNOSIS — E7849 Other hyperlipidemia: Secondary | ICD-10-CM | POA: Diagnosis not present

## 2019-07-16 DIAGNOSIS — M5440 Lumbago with sciatica, unspecified side: Secondary | ICD-10-CM | POA: Diagnosis not present

## 2019-07-23 DIAGNOSIS — M5440 Lumbago with sciatica, unspecified side: Secondary | ICD-10-CM | POA: Diagnosis not present

## 2019-07-25 DIAGNOSIS — R7401 Elevation of levels of liver transaminase levels: Secondary | ICD-10-CM | POA: Diagnosis not present

## 2019-07-25 DIAGNOSIS — Z0000 Encounter for general adult medical examination without abnormal findings: Secondary | ICD-10-CM | POA: Diagnosis not present

## 2019-07-25 DIAGNOSIS — M5416 Radiculopathy, lumbar region: Secondary | ICD-10-CM | POA: Diagnosis not present

## 2019-07-25 DIAGNOSIS — R7301 Impaired fasting glucose: Secondary | ICD-10-CM | POA: Diagnosis not present

## 2019-07-25 DIAGNOSIS — M5440 Lumbago with sciatica, unspecified side: Secondary | ICD-10-CM | POA: Diagnosis not present

## 2019-07-25 DIAGNOSIS — E785 Hyperlipidemia, unspecified: Secondary | ICD-10-CM | POA: Diagnosis not present

## 2019-07-26 DIAGNOSIS — Z1212 Encounter for screening for malignant neoplasm of rectum: Secondary | ICD-10-CM | POA: Diagnosis not present

## 2019-08-06 DIAGNOSIS — M5440 Lumbago with sciatica, unspecified side: Secondary | ICD-10-CM | POA: Diagnosis not present

## 2019-08-13 DIAGNOSIS — M5440 Lumbago with sciatica, unspecified side: Secondary | ICD-10-CM | POA: Diagnosis not present

## 2019-08-20 DIAGNOSIS — M5440 Lumbago with sciatica, unspecified side: Secondary | ICD-10-CM | POA: Diagnosis not present

## 2019-08-26 DIAGNOSIS — R7401 Elevation of levels of liver transaminase levels: Secondary | ICD-10-CM | POA: Diagnosis not present

## 2019-08-27 DIAGNOSIS — M5440 Lumbago with sciatica, unspecified side: Secondary | ICD-10-CM | POA: Diagnosis not present

## 2019-09-03 DIAGNOSIS — M5440 Lumbago with sciatica, unspecified side: Secondary | ICD-10-CM | POA: Diagnosis not present

## 2019-09-25 DIAGNOSIS — M25551 Pain in right hip: Secondary | ICD-10-CM | POA: Diagnosis not present

## 2019-09-25 DIAGNOSIS — M160 Bilateral primary osteoarthritis of hip: Secondary | ICD-10-CM | POA: Diagnosis not present

## 2019-12-12 DIAGNOSIS — M25512 Pain in left shoulder: Secondary | ICD-10-CM | POA: Diagnosis not present

## 2019-12-12 DIAGNOSIS — M25511 Pain in right shoulder: Secondary | ICD-10-CM | POA: Diagnosis not present

## 2019-12-25 DIAGNOSIS — C44612 Basal cell carcinoma of skin of right upper limb, including shoulder: Secondary | ICD-10-CM | POA: Diagnosis not present

## 2019-12-25 DIAGNOSIS — D225 Melanocytic nevi of trunk: Secondary | ICD-10-CM | POA: Diagnosis not present

## 2019-12-25 DIAGNOSIS — D1801 Hemangioma of skin and subcutaneous tissue: Secondary | ICD-10-CM | POA: Diagnosis not present

## 2019-12-25 DIAGNOSIS — L812 Freckles: Secondary | ICD-10-CM | POA: Diagnosis not present

## 2019-12-25 DIAGNOSIS — L814 Other melanin hyperpigmentation: Secondary | ICD-10-CM | POA: Diagnosis not present

## 2020-02-11 DIAGNOSIS — M47816 Spondylosis without myelopathy or radiculopathy, lumbar region: Secondary | ICD-10-CM | POA: Diagnosis not present

## 2020-02-11 DIAGNOSIS — M76899 Other specified enthesopathies of unspecified lower limb, excluding foot: Secondary | ICD-10-CM | POA: Diagnosis not present

## 2020-03-10 DIAGNOSIS — M47816 Spondylosis without myelopathy or radiculopathy, lumbar region: Secondary | ICD-10-CM | POA: Diagnosis not present

## 2020-03-10 DIAGNOSIS — R030 Elevated blood-pressure reading, without diagnosis of hypertension: Secondary | ICD-10-CM | POA: Diagnosis not present

## 2020-04-30 DIAGNOSIS — M47816 Spondylosis without myelopathy or radiculopathy, lumbar region: Secondary | ICD-10-CM | POA: Diagnosis not present

## 2020-04-30 DIAGNOSIS — M545 Low back pain, unspecified: Secondary | ICD-10-CM | POA: Diagnosis not present

## 2020-05-25 DIAGNOSIS — M5440 Lumbago with sciatica, unspecified side: Secondary | ICD-10-CM | POA: Diagnosis not present

## 2020-05-25 DIAGNOSIS — L218 Other seborrheic dermatitis: Secondary | ICD-10-CM | POA: Diagnosis not present

## 2020-05-27 DIAGNOSIS — M5440 Lumbago with sciatica, unspecified side: Secondary | ICD-10-CM | POA: Diagnosis not present

## 2020-06-08 DIAGNOSIS — M5440 Lumbago with sciatica, unspecified side: Secondary | ICD-10-CM | POA: Diagnosis not present

## 2020-06-29 DIAGNOSIS — H5211 Myopia, right eye: Secondary | ICD-10-CM | POA: Diagnosis not present

## 2020-06-29 DIAGNOSIS — D3142 Benign neoplasm of left ciliary body: Secondary | ICD-10-CM | POA: Diagnosis not present

## 2020-06-29 DIAGNOSIS — H2513 Age-related nuclear cataract, bilateral: Secondary | ICD-10-CM | POA: Diagnosis not present

## 2020-06-29 DIAGNOSIS — D3131 Benign neoplasm of right choroid: Secondary | ICD-10-CM | POA: Diagnosis not present

## 2020-08-05 DIAGNOSIS — Z125 Encounter for screening for malignant neoplasm of prostate: Secondary | ICD-10-CM | POA: Diagnosis not present

## 2020-08-05 DIAGNOSIS — E785 Hyperlipidemia, unspecified: Secondary | ICD-10-CM | POA: Diagnosis not present

## 2020-08-05 DIAGNOSIS — Z148 Genetic carrier of other disease: Secondary | ICD-10-CM | POA: Diagnosis not present

## 2020-08-13 DIAGNOSIS — Z1339 Encounter for screening examination for other mental health and behavioral disorders: Secondary | ICD-10-CM | POA: Diagnosis not present

## 2020-08-13 DIAGNOSIS — Z1331 Encounter for screening for depression: Secondary | ICD-10-CM | POA: Diagnosis not present

## 2020-08-13 DIAGNOSIS — Z0000 Encounter for general adult medical examination without abnormal findings: Secondary | ICD-10-CM | POA: Diagnosis not present

## 2020-08-13 DIAGNOSIS — R82998 Other abnormal findings in urine: Secondary | ICD-10-CM | POA: Diagnosis not present

## 2020-08-13 DIAGNOSIS — E785 Hyperlipidemia, unspecified: Secondary | ICD-10-CM | POA: Diagnosis not present

## 2020-08-13 DIAGNOSIS — R7301 Impaired fasting glucose: Secondary | ICD-10-CM | POA: Diagnosis not present

## 2020-08-17 DIAGNOSIS — M25512 Pain in left shoulder: Secondary | ICD-10-CM | POA: Diagnosis not present

## 2020-08-17 DIAGNOSIS — M25511 Pain in right shoulder: Secondary | ICD-10-CM | POA: Diagnosis not present

## 2020-12-30 DIAGNOSIS — D2272 Melanocytic nevi of left lower limb, including hip: Secondary | ICD-10-CM | POA: Diagnosis not present

## 2020-12-30 DIAGNOSIS — D1801 Hemangioma of skin and subcutaneous tissue: Secondary | ICD-10-CM | POA: Diagnosis not present

## 2020-12-30 DIAGNOSIS — C44519 Basal cell carcinoma of skin of other part of trunk: Secondary | ICD-10-CM | POA: Diagnosis not present

## 2020-12-30 DIAGNOSIS — L821 Other seborrheic keratosis: Secondary | ICD-10-CM | POA: Diagnosis not present

## 2020-12-30 DIAGNOSIS — Z85828 Personal history of other malignant neoplasm of skin: Secondary | ICD-10-CM | POA: Diagnosis not present

## 2021-04-24 DIAGNOSIS — M25562 Pain in left knee: Secondary | ICD-10-CM | POA: Diagnosis not present

## 2021-04-24 DIAGNOSIS — S8392XA Sprain of unspecified site of left knee, initial encounter: Secondary | ICD-10-CM | POA: Diagnosis not present

## 2021-04-24 DIAGNOSIS — Y9323 Activity, snow (alpine) (downhill) skiing, snow boarding, sledding, tobogganing and snow tubing: Secondary | ICD-10-CM | POA: Diagnosis not present

## 2021-04-24 DIAGNOSIS — M1712 Unilateral primary osteoarthritis, left knee: Secondary | ICD-10-CM | POA: Diagnosis not present

## 2021-04-24 DIAGNOSIS — Y92828 Other wilderness area as the place of occurrence of the external cause: Secondary | ICD-10-CM | POA: Diagnosis not present

## 2021-04-29 DIAGNOSIS — M25562 Pain in left knee: Secondary | ICD-10-CM | POA: Diagnosis not present

## 2021-05-06 DIAGNOSIS — M25562 Pain in left knee: Secondary | ICD-10-CM | POA: Diagnosis not present

## 2021-05-10 DIAGNOSIS — M25562 Pain in left knee: Secondary | ICD-10-CM | POA: Diagnosis not present

## 2021-05-12 DIAGNOSIS — M6281 Muscle weakness (generalized): Secondary | ICD-10-CM | POA: Diagnosis not present

## 2021-05-12 DIAGNOSIS — M25662 Stiffness of left knee, not elsewhere classified: Secondary | ICD-10-CM | POA: Diagnosis not present

## 2021-05-12 DIAGNOSIS — R269 Unspecified abnormalities of gait and mobility: Secondary | ICD-10-CM | POA: Diagnosis not present

## 2021-05-12 DIAGNOSIS — S83512D Sprain of anterior cruciate ligament of left knee, subsequent encounter: Secondary | ICD-10-CM | POA: Diagnosis not present

## 2021-05-17 DIAGNOSIS — S83512D Sprain of anterior cruciate ligament of left knee, subsequent encounter: Secondary | ICD-10-CM | POA: Diagnosis not present

## 2021-05-17 DIAGNOSIS — R269 Unspecified abnormalities of gait and mobility: Secondary | ICD-10-CM | POA: Diagnosis not present

## 2021-05-17 DIAGNOSIS — M6281 Muscle weakness (generalized): Secondary | ICD-10-CM | POA: Diagnosis not present

## 2021-05-17 DIAGNOSIS — M25662 Stiffness of left knee, not elsewhere classified: Secondary | ICD-10-CM | POA: Diagnosis not present

## 2021-05-19 DIAGNOSIS — R269 Unspecified abnormalities of gait and mobility: Secondary | ICD-10-CM | POA: Diagnosis not present

## 2021-05-19 DIAGNOSIS — M25662 Stiffness of left knee, not elsewhere classified: Secondary | ICD-10-CM | POA: Diagnosis not present

## 2021-05-19 DIAGNOSIS — M6281 Muscle weakness (generalized): Secondary | ICD-10-CM | POA: Diagnosis not present

## 2021-05-19 DIAGNOSIS — S83512D Sprain of anterior cruciate ligament of left knee, subsequent encounter: Secondary | ICD-10-CM | POA: Diagnosis not present

## 2021-05-26 DIAGNOSIS — M25662 Stiffness of left knee, not elsewhere classified: Secondary | ICD-10-CM | POA: Diagnosis not present

## 2021-05-26 DIAGNOSIS — R269 Unspecified abnormalities of gait and mobility: Secondary | ICD-10-CM | POA: Diagnosis not present

## 2021-05-26 DIAGNOSIS — S83512D Sprain of anterior cruciate ligament of left knee, subsequent encounter: Secondary | ICD-10-CM | POA: Diagnosis not present

## 2021-05-26 DIAGNOSIS — M6281 Muscle weakness (generalized): Secondary | ICD-10-CM | POA: Diagnosis not present

## 2021-06-14 DIAGNOSIS — S83512D Sprain of anterior cruciate ligament of left knee, subsequent encounter: Secondary | ICD-10-CM | POA: Diagnosis not present

## 2021-06-14 DIAGNOSIS — M25662 Stiffness of left knee, not elsewhere classified: Secondary | ICD-10-CM | POA: Diagnosis not present

## 2021-06-14 DIAGNOSIS — M6281 Muscle weakness (generalized): Secondary | ICD-10-CM | POA: Diagnosis not present

## 2021-06-14 DIAGNOSIS — R269 Unspecified abnormalities of gait and mobility: Secondary | ICD-10-CM | POA: Diagnosis not present

## 2021-06-16 DIAGNOSIS — Y999 Unspecified external cause status: Secondary | ICD-10-CM | POA: Diagnosis not present

## 2021-06-16 DIAGNOSIS — M1712 Unilateral primary osteoarthritis, left knee: Secondary | ICD-10-CM | POA: Diagnosis not present

## 2021-06-16 DIAGNOSIS — S83512A Sprain of anterior cruciate ligament of left knee, initial encounter: Secondary | ICD-10-CM | POA: Diagnosis not present

## 2021-06-16 DIAGNOSIS — X58XXXA Exposure to other specified factors, initial encounter: Secondary | ICD-10-CM | POA: Diagnosis not present

## 2021-06-16 DIAGNOSIS — G8918 Other acute postprocedural pain: Secondary | ICD-10-CM | POA: Diagnosis not present

## 2021-06-16 DIAGNOSIS — S83242A Other tear of medial meniscus, current injury, left knee, initial encounter: Secondary | ICD-10-CM | POA: Diagnosis not present

## 2021-06-21 DIAGNOSIS — S83512D Sprain of anterior cruciate ligament of left knee, subsequent encounter: Secondary | ICD-10-CM | POA: Diagnosis not present

## 2021-06-21 DIAGNOSIS — M25662 Stiffness of left knee, not elsewhere classified: Secondary | ICD-10-CM | POA: Diagnosis not present

## 2021-06-21 DIAGNOSIS — M6281 Muscle weakness (generalized): Secondary | ICD-10-CM | POA: Diagnosis not present

## 2021-06-21 DIAGNOSIS — R269 Unspecified abnormalities of gait and mobility: Secondary | ICD-10-CM | POA: Diagnosis not present

## 2021-06-23 DIAGNOSIS — S83512D Sprain of anterior cruciate ligament of left knee, subsequent encounter: Secondary | ICD-10-CM | POA: Diagnosis not present

## 2021-06-23 DIAGNOSIS — M25662 Stiffness of left knee, not elsewhere classified: Secondary | ICD-10-CM | POA: Diagnosis not present

## 2021-06-23 DIAGNOSIS — R269 Unspecified abnormalities of gait and mobility: Secondary | ICD-10-CM | POA: Diagnosis not present

## 2021-06-23 DIAGNOSIS — M6281 Muscle weakness (generalized): Secondary | ICD-10-CM | POA: Diagnosis not present

## 2021-06-28 DIAGNOSIS — M6281 Muscle weakness (generalized): Secondary | ICD-10-CM | POA: Diagnosis not present

## 2021-06-28 DIAGNOSIS — M25662 Stiffness of left knee, not elsewhere classified: Secondary | ICD-10-CM | POA: Diagnosis not present

## 2021-06-28 DIAGNOSIS — R269 Unspecified abnormalities of gait and mobility: Secondary | ICD-10-CM | POA: Diagnosis not present

## 2021-06-28 DIAGNOSIS — S83512D Sprain of anterior cruciate ligament of left knee, subsequent encounter: Secondary | ICD-10-CM | POA: Diagnosis not present

## 2021-06-30 DIAGNOSIS — S83512D Sprain of anterior cruciate ligament of left knee, subsequent encounter: Secondary | ICD-10-CM | POA: Diagnosis not present

## 2021-06-30 DIAGNOSIS — M6281 Muscle weakness (generalized): Secondary | ICD-10-CM | POA: Diagnosis not present

## 2021-06-30 DIAGNOSIS — R269 Unspecified abnormalities of gait and mobility: Secondary | ICD-10-CM | POA: Diagnosis not present

## 2021-06-30 DIAGNOSIS — M25662 Stiffness of left knee, not elsewhere classified: Secondary | ICD-10-CM | POA: Diagnosis not present

## 2021-07-05 DIAGNOSIS — R269 Unspecified abnormalities of gait and mobility: Secondary | ICD-10-CM | POA: Diagnosis not present

## 2021-07-05 DIAGNOSIS — M6281 Muscle weakness (generalized): Secondary | ICD-10-CM | POA: Diagnosis not present

## 2021-07-05 DIAGNOSIS — H2513 Age-related nuclear cataract, bilateral: Secondary | ICD-10-CM | POA: Diagnosis not present

## 2021-07-05 DIAGNOSIS — M25662 Stiffness of left knee, not elsewhere classified: Secondary | ICD-10-CM | POA: Diagnosis not present

## 2021-07-05 DIAGNOSIS — H25013 Cortical age-related cataract, bilateral: Secondary | ICD-10-CM | POA: Diagnosis not present

## 2021-07-05 DIAGNOSIS — D3131 Benign neoplasm of right choroid: Secondary | ICD-10-CM | POA: Diagnosis not present

## 2021-07-05 DIAGNOSIS — H5211 Myopia, right eye: Secondary | ICD-10-CM | POA: Diagnosis not present

## 2021-07-05 DIAGNOSIS — S83512D Sprain of anterior cruciate ligament of left knee, subsequent encounter: Secondary | ICD-10-CM | POA: Diagnosis not present

## 2021-07-07 DIAGNOSIS — M6281 Muscle weakness (generalized): Secondary | ICD-10-CM | POA: Diagnosis not present

## 2021-07-07 DIAGNOSIS — M25662 Stiffness of left knee, not elsewhere classified: Secondary | ICD-10-CM | POA: Diagnosis not present

## 2021-07-07 DIAGNOSIS — R269 Unspecified abnormalities of gait and mobility: Secondary | ICD-10-CM | POA: Diagnosis not present

## 2021-07-07 DIAGNOSIS — S83512D Sprain of anterior cruciate ligament of left knee, subsequent encounter: Secondary | ICD-10-CM | POA: Diagnosis not present

## 2021-07-12 DIAGNOSIS — M6281 Muscle weakness (generalized): Secondary | ICD-10-CM | POA: Diagnosis not present

## 2021-07-12 DIAGNOSIS — M25662 Stiffness of left knee, not elsewhere classified: Secondary | ICD-10-CM | POA: Diagnosis not present

## 2021-07-12 DIAGNOSIS — R269 Unspecified abnormalities of gait and mobility: Secondary | ICD-10-CM | POA: Diagnosis not present

## 2021-07-12 DIAGNOSIS — S83512D Sprain of anterior cruciate ligament of left knee, subsequent encounter: Secondary | ICD-10-CM | POA: Diagnosis not present

## 2021-07-14 DIAGNOSIS — S83512D Sprain of anterior cruciate ligament of left knee, subsequent encounter: Secondary | ICD-10-CM | POA: Diagnosis not present

## 2021-07-14 DIAGNOSIS — R269 Unspecified abnormalities of gait and mobility: Secondary | ICD-10-CM | POA: Diagnosis not present

## 2021-07-14 DIAGNOSIS — M6281 Muscle weakness (generalized): Secondary | ICD-10-CM | POA: Diagnosis not present

## 2021-07-14 DIAGNOSIS — M25662 Stiffness of left knee, not elsewhere classified: Secondary | ICD-10-CM | POA: Diagnosis not present

## 2021-07-19 DIAGNOSIS — M25662 Stiffness of left knee, not elsewhere classified: Secondary | ICD-10-CM | POA: Diagnosis not present

## 2021-07-19 DIAGNOSIS — S83512D Sprain of anterior cruciate ligament of left knee, subsequent encounter: Secondary | ICD-10-CM | POA: Diagnosis not present

## 2021-07-19 DIAGNOSIS — M6281 Muscle weakness (generalized): Secondary | ICD-10-CM | POA: Diagnosis not present

## 2021-07-19 DIAGNOSIS — R269 Unspecified abnormalities of gait and mobility: Secondary | ICD-10-CM | POA: Diagnosis not present

## 2021-07-21 DIAGNOSIS — M25662 Stiffness of left knee, not elsewhere classified: Secondary | ICD-10-CM | POA: Diagnosis not present

## 2021-07-21 DIAGNOSIS — S83512D Sprain of anterior cruciate ligament of left knee, subsequent encounter: Secondary | ICD-10-CM | POA: Diagnosis not present

## 2021-07-21 DIAGNOSIS — M6281 Muscle weakness (generalized): Secondary | ICD-10-CM | POA: Diagnosis not present

## 2021-07-21 DIAGNOSIS — R269 Unspecified abnormalities of gait and mobility: Secondary | ICD-10-CM | POA: Diagnosis not present

## 2021-07-27 DIAGNOSIS — M25662 Stiffness of left knee, not elsewhere classified: Secondary | ICD-10-CM | POA: Diagnosis not present

## 2021-07-27 DIAGNOSIS — S83512D Sprain of anterior cruciate ligament of left knee, subsequent encounter: Secondary | ICD-10-CM | POA: Diagnosis not present

## 2021-07-27 DIAGNOSIS — R269 Unspecified abnormalities of gait and mobility: Secondary | ICD-10-CM | POA: Diagnosis not present

## 2021-07-27 DIAGNOSIS — M6281 Muscle weakness (generalized): Secondary | ICD-10-CM | POA: Diagnosis not present

## 2021-08-06 DIAGNOSIS — S83512D Sprain of anterior cruciate ligament of left knee, subsequent encounter: Secondary | ICD-10-CM | POA: Diagnosis not present

## 2021-08-06 DIAGNOSIS — R269 Unspecified abnormalities of gait and mobility: Secondary | ICD-10-CM | POA: Diagnosis not present

## 2021-08-06 DIAGNOSIS — M25662 Stiffness of left knee, not elsewhere classified: Secondary | ICD-10-CM | POA: Diagnosis not present

## 2021-08-06 DIAGNOSIS — M6281 Muscle weakness (generalized): Secondary | ICD-10-CM | POA: Diagnosis not present

## 2021-08-09 DIAGNOSIS — M25662 Stiffness of left knee, not elsewhere classified: Secondary | ICD-10-CM | POA: Diagnosis not present

## 2021-08-09 DIAGNOSIS — R269 Unspecified abnormalities of gait and mobility: Secondary | ICD-10-CM | POA: Diagnosis not present

## 2021-08-09 DIAGNOSIS — M6281 Muscle weakness (generalized): Secondary | ICD-10-CM | POA: Diagnosis not present

## 2021-08-09 DIAGNOSIS — S83512D Sprain of anterior cruciate ligament of left knee, subsequent encounter: Secondary | ICD-10-CM | POA: Diagnosis not present

## 2021-08-10 DIAGNOSIS — D044 Carcinoma in situ of skin of scalp and neck: Secondary | ICD-10-CM | POA: Diagnosis not present

## 2021-08-11 DIAGNOSIS — M25662 Stiffness of left knee, not elsewhere classified: Secondary | ICD-10-CM | POA: Diagnosis not present

## 2021-08-11 DIAGNOSIS — S83512D Sprain of anterior cruciate ligament of left knee, subsequent encounter: Secondary | ICD-10-CM | POA: Diagnosis not present

## 2021-08-11 DIAGNOSIS — R269 Unspecified abnormalities of gait and mobility: Secondary | ICD-10-CM | POA: Diagnosis not present

## 2021-08-11 DIAGNOSIS — M6281 Muscle weakness (generalized): Secondary | ICD-10-CM | POA: Diagnosis not present

## 2021-08-16 DIAGNOSIS — S83512D Sprain of anterior cruciate ligament of left knee, subsequent encounter: Secondary | ICD-10-CM | POA: Diagnosis not present

## 2021-08-16 DIAGNOSIS — M6281 Muscle weakness (generalized): Secondary | ICD-10-CM | POA: Diagnosis not present

## 2021-08-16 DIAGNOSIS — R269 Unspecified abnormalities of gait and mobility: Secondary | ICD-10-CM | POA: Diagnosis not present

## 2021-08-16 DIAGNOSIS — M25662 Stiffness of left knee, not elsewhere classified: Secondary | ICD-10-CM | POA: Diagnosis not present

## 2021-08-27 DIAGNOSIS — M6281 Muscle weakness (generalized): Secondary | ICD-10-CM | POA: Diagnosis not present

## 2021-08-27 DIAGNOSIS — R269 Unspecified abnormalities of gait and mobility: Secondary | ICD-10-CM | POA: Diagnosis not present

## 2021-08-27 DIAGNOSIS — M25662 Stiffness of left knee, not elsewhere classified: Secondary | ICD-10-CM | POA: Diagnosis not present

## 2021-08-27 DIAGNOSIS — S83512D Sprain of anterior cruciate ligament of left knee, subsequent encounter: Secondary | ICD-10-CM | POA: Diagnosis not present

## 2021-08-30 DIAGNOSIS — E785 Hyperlipidemia, unspecified: Secondary | ICD-10-CM | POA: Diagnosis not present

## 2021-08-30 DIAGNOSIS — Z125 Encounter for screening for malignant neoplasm of prostate: Secondary | ICD-10-CM | POA: Diagnosis not present

## 2021-09-08 DIAGNOSIS — L820 Inflamed seborrheic keratosis: Secondary | ICD-10-CM | POA: Diagnosis not present

## 2021-09-10 DIAGNOSIS — Z1331 Encounter for screening for depression: Secondary | ICD-10-CM | POA: Diagnosis not present

## 2021-09-10 DIAGNOSIS — R7301 Impaired fasting glucose: Secondary | ICD-10-CM | POA: Diagnosis not present

## 2021-09-10 DIAGNOSIS — M25662 Stiffness of left knee, not elsewhere classified: Secondary | ICD-10-CM | POA: Diagnosis not present

## 2021-09-10 DIAGNOSIS — R269 Unspecified abnormalities of gait and mobility: Secondary | ICD-10-CM | POA: Diagnosis not present

## 2021-09-10 DIAGNOSIS — Z0000 Encounter for general adult medical examination without abnormal findings: Secondary | ICD-10-CM | POA: Diagnosis not present

## 2021-09-10 DIAGNOSIS — M6281 Muscle weakness (generalized): Secondary | ICD-10-CM | POA: Diagnosis not present

## 2021-09-10 DIAGNOSIS — Z148 Genetic carrier of other disease: Secondary | ICD-10-CM | POA: Diagnosis not present

## 2021-09-10 DIAGNOSIS — Z1339 Encounter for screening examination for other mental health and behavioral disorders: Secondary | ICD-10-CM | POA: Diagnosis not present

## 2021-09-10 DIAGNOSIS — S83512D Sprain of anterior cruciate ligament of left knee, subsequent encounter: Secondary | ICD-10-CM | POA: Diagnosis not present

## 2021-09-10 DIAGNOSIS — R82998 Other abnormal findings in urine: Secondary | ICD-10-CM | POA: Diagnosis not present

## 2021-09-16 DIAGNOSIS — M23322 Other meniscus derangements, posterior horn of medial meniscus, left knee: Secondary | ICD-10-CM | POA: Diagnosis not present

## 2021-09-17 DIAGNOSIS — R269 Unspecified abnormalities of gait and mobility: Secondary | ICD-10-CM | POA: Diagnosis not present

## 2021-09-17 DIAGNOSIS — M6281 Muscle weakness (generalized): Secondary | ICD-10-CM | POA: Diagnosis not present

## 2021-09-17 DIAGNOSIS — M25662 Stiffness of left knee, not elsewhere classified: Secondary | ICD-10-CM | POA: Diagnosis not present

## 2021-09-17 DIAGNOSIS — S83512D Sprain of anterior cruciate ligament of left knee, subsequent encounter: Secondary | ICD-10-CM | POA: Diagnosis not present

## 2021-09-29 DIAGNOSIS — M6281 Muscle weakness (generalized): Secondary | ICD-10-CM | POA: Diagnosis not present

## 2021-09-29 DIAGNOSIS — S83512D Sprain of anterior cruciate ligament of left knee, subsequent encounter: Secondary | ICD-10-CM | POA: Diagnosis not present

## 2021-09-29 DIAGNOSIS — R269 Unspecified abnormalities of gait and mobility: Secondary | ICD-10-CM | POA: Diagnosis not present

## 2021-09-29 DIAGNOSIS — M25662 Stiffness of left knee, not elsewhere classified: Secondary | ICD-10-CM | POA: Diagnosis not present

## 2021-12-23 DIAGNOSIS — M23322 Other meniscus derangements, posterior horn of medial meniscus, left knee: Secondary | ICD-10-CM | POA: Diagnosis not present

## 2022-01-18 DIAGNOSIS — Z85828 Personal history of other malignant neoplasm of skin: Secondary | ICD-10-CM | POA: Diagnosis not present

## 2022-01-18 DIAGNOSIS — L821 Other seborrheic keratosis: Secondary | ICD-10-CM | POA: Diagnosis not present

## 2022-01-18 DIAGNOSIS — D1801 Hemangioma of skin and subcutaneous tissue: Secondary | ICD-10-CM | POA: Diagnosis not present

## 2022-01-18 DIAGNOSIS — D225 Melanocytic nevi of trunk: Secondary | ICD-10-CM | POA: Diagnosis not present

## 2022-01-18 DIAGNOSIS — L812 Freckles: Secondary | ICD-10-CM | POA: Diagnosis not present

## 2022-02-02 DIAGNOSIS — L570 Actinic keratosis: Secondary | ICD-10-CM | POA: Diagnosis not present

## 2022-02-08 DIAGNOSIS — M25561 Pain in right knee: Secondary | ICD-10-CM | POA: Diagnosis not present

## 2022-02-08 DIAGNOSIS — M545 Low back pain, unspecified: Secondary | ICD-10-CM | POA: Diagnosis not present

## 2022-02-14 DIAGNOSIS — M7702 Medial epicondylitis, left elbow: Secondary | ICD-10-CM | POA: Diagnosis not present

## 2022-03-15 DIAGNOSIS — M1711 Unilateral primary osteoarthritis, right knee: Secondary | ICD-10-CM | POA: Diagnosis not present

## 2022-03-22 DIAGNOSIS — M1711 Unilateral primary osteoarthritis, right knee: Secondary | ICD-10-CM | POA: Diagnosis not present

## 2022-03-29 DIAGNOSIS — M1711 Unilateral primary osteoarthritis, right knee: Secondary | ICD-10-CM | POA: Diagnosis not present

## 2022-04-01 DIAGNOSIS — M1711 Unilateral primary osteoarthritis, right knee: Secondary | ICD-10-CM | POA: Diagnosis not present

## 2022-06-29 DIAGNOSIS — H52203 Unspecified astigmatism, bilateral: Secondary | ICD-10-CM | POA: Diagnosis not present

## 2022-06-30 DIAGNOSIS — K080 Exfoliation of teeth due to systemic causes: Secondary | ICD-10-CM | POA: Diagnosis not present

## 2022-11-16 DIAGNOSIS — E785 Hyperlipidemia, unspecified: Secondary | ICD-10-CM | POA: Diagnosis not present

## 2022-11-16 DIAGNOSIS — N400 Enlarged prostate without lower urinary tract symptoms: Secondary | ICD-10-CM | POA: Diagnosis not present

## 2022-11-16 DIAGNOSIS — Z148 Genetic carrier of other disease: Secondary | ICD-10-CM | POA: Diagnosis not present

## 2022-11-16 DIAGNOSIS — R7989 Other specified abnormal findings of blood chemistry: Secondary | ICD-10-CM | POA: Diagnosis not present

## 2022-11-17 DIAGNOSIS — K080 Exfoliation of teeth due to systemic causes: Secondary | ICD-10-CM | POA: Diagnosis not present

## 2022-11-23 DIAGNOSIS — R7301 Impaired fasting glucose: Secondary | ICD-10-CM | POA: Diagnosis not present

## 2022-11-23 DIAGNOSIS — Z23 Encounter for immunization: Secondary | ICD-10-CM | POA: Diagnosis not present

## 2022-11-23 DIAGNOSIS — R82998 Other abnormal findings in urine: Secondary | ICD-10-CM | POA: Diagnosis not present

## 2022-11-23 DIAGNOSIS — Z0000 Encounter for general adult medical examination without abnormal findings: Secondary | ICD-10-CM | POA: Diagnosis not present

## 2022-11-24 ENCOUNTER — Other Ambulatory Visit: Payer: Self-pay | Admitting: Internal Medicine

## 2022-11-24 ENCOUNTER — Encounter: Payer: Self-pay | Admitting: Gastroenterology

## 2022-11-24 DIAGNOSIS — Z136 Encounter for screening for cardiovascular disorders: Secondary | ICD-10-CM

## 2022-11-29 DIAGNOSIS — M25562 Pain in left knee: Secondary | ICD-10-CM | POA: Diagnosis not present

## 2022-11-30 ENCOUNTER — Ambulatory Visit
Admission: RE | Admit: 2022-11-30 | Discharge: 2022-11-30 | Disposition: A | Discharge status: Home / Self Care | Payer: BLUE CROSS/BLUE SHIELD | Source: Ambulatory Visit | Attending: Internal Medicine | Admitting: Internal Medicine

## 2022-11-30 DIAGNOSIS — Z136 Encounter for screening for cardiovascular disorders: Secondary | ICD-10-CM | POA: Diagnosis not present

## 2022-11-30 DIAGNOSIS — E785 Hyperlipidemia, unspecified: Secondary | ICD-10-CM | POA: Diagnosis not present

## 2022-11-30 DIAGNOSIS — R1310 Dysphagia, unspecified: Secondary | ICD-10-CM | POA: Diagnosis not present

## 2022-11-30 DIAGNOSIS — K219 Gastro-esophageal reflux disease without esophagitis: Secondary | ICD-10-CM | POA: Diagnosis not present

## 2022-12-01 ENCOUNTER — Encounter: Payer: Self-pay | Admitting: Gastroenterology

## 2022-12-01 ENCOUNTER — Ambulatory Visit: Payer: Medicare Other | Admitting: Gastroenterology

## 2022-12-01 VITALS — BP 110/70 | HR 65 | Ht 70.0 in | Wt 173.0 lb

## 2022-12-01 DIAGNOSIS — K2100 Gastro-esophageal reflux disease with esophagitis, without bleeding: Secondary | ICD-10-CM

## 2022-12-01 DIAGNOSIS — R1310 Dysphagia, unspecified: Secondary | ICD-10-CM | POA: Insufficient documentation

## 2022-12-01 NOTE — Patient Instructions (Signed)
You have been scheduled for an endoscopy. Please follow written instructions given to you at your visit today.  If you use inhalers (even only as needed), please bring them with you on the day of your procedure.  If you take any of the following medications, they will need to be adjusted prior to your procedure:   DO NOT TAKE 7 DAYS PRIOR TO TEST- Trulicity (dulaglutide) Ozempic, Wegovy (semaglutide) Mounjaro (tirzepatide) Bydureon Bcise (exanatide extended release)  DO NOT TAKE 1 DAY PRIOR TO YOUR TEST Rybelsus (semaglutide) Adlyxin (lixisenatide) Victoza (liraglutide) Byetta (exanatide) ___________________________________________________________________________  _______________________________________________________  If your blood pressure at your visit was 140/90 or greater, please contact your primary care physician to follow up on this.  _______________________________________________________  If you are age 83 or older, your body mass index should be between 23-30. Your Body mass index is 24.82 kg/m. If this is out of the aforementioned range listed, please consider follow up with your Primary Care Provider.  If you are age 2 or younger, your body mass index should be between 19-25. Your Body mass index is 24.82 kg/m. If this is out of the aformentioned range listed, please consider follow up with your Primary Care Provider.   ________________________________________________________  The [facility] providers would like to encourage you to use [facility] to communicate with providers for non-urgent requests or questions.  Due to long hold times on the telephone, sending your provider a message by [facility] may be a faster and more efficient way to get a response.  Please allow 48 business hours for a response.  Please remember that this is for non-urgent requests.  _______________________________________________________

## 2022-12-01 NOTE — Progress Notes (Signed)
12/01/2022 Jason Vaughn [id number] 15-Oct-1955   HISTORY OF PRESENT ILLNESS:  This is a 67 year old male who is a patient of Jason Vaughn.  He is here today at the request of his PCP, Jason Vaughn, for evaluation of dysphagia.  This has been occurring and worsening over the past 3 to 4 months.  Describes this as being high up almost in the back of his throat, however, he does have a history of reflux and grade A esophagitis as seen on previous endoscopy in 2017.  He said that his PCP was very insistent that he has an EGD performed.  He just started on pantoprazole 40 mg daily a few days ago and not been on anything for reflux for some time prior to that.  No weight loss, abdominal pain, etc.   Past Medical History:  Diagnosis Date   Acetabular labrum tear    right; [facility]   Allergy    periennial   Arthritis    osteoarthritis   Diverticulosis of colon    GERD (gastroesophageal reflux disease)    Hyperlipidemia    Prostatitis    Skin cancer    skin: basal & squamous cell   Tear of MCL (medial collateral ligament) of knee    [facility]   Past Surgical History:  Procedure Laterality Date   COLONOSCOPY  2005 & 2015   diverticulosis; Jason Jason Vaughn HERNIA REPAIR Bilateral 11/10/2014   SHOULDER SURGERY Bilateral 2016    reports that he quit smoking about 42 years ago. His smoking use included cigarettes. He has never used smokeless tobacco. He reports current alcohol use of about 7.0 standard drinks of alcohol per week. He reports that he does not use drugs. family history includes Alzheimer's disease in his maternal grandmother; Breast cancer in his maternal grandmother; Cancer in his paternal grandmother; Coronary artery disease in his mother; Eosinophilic granuloma in his father; Heart attack in his maternal grandmother; Heart attack (age of onset: 60) in an other family member; Heart attack (age of onset: 3) in his maternal grandfather; Hypertension in his brother; Lung cancer in his  father; Mental illness in his brother. Allergies  Allergen Reactions   Bee Venom Swelling      Outpatient Encounter Medications as of 12/01/2022  Medication Sig   atorvastatin (LIPITOR) 20 MG tablet Take 20 mg by mouth daily.   CIALIS 5 MG tablet TK 1 T PO QD   fluticasone (FLONASE) 50 MCG/ACT nasal spray Place 2 sprays into both nostrils daily.   Multiple Vitamins-Minerals (MULTIVITAL) tablet Take 1 tablet by mouth daily.     pantoprazole (PROTONIX) 40 MG tablet Take 1 tablet (40 mg total) by mouth daily.   [DISCONTINUED] aspirin EC 81 MG tablet Take 81 mg by mouth daily.   [DISCONTINUED] fish oil-omega-3 fatty acids 1000 MG capsule Take 2 g by mouth as needed. Reported on 06/22/2015   [DISCONTINUED] meloxicam (MOBIC) 15 MG tablet Reported on 06/22/2015   [DISCONTINUED] pravastatin (PRAVACHOL) 10 MG tablet take 1 tablet by mouth once daily for cholesterol (LDL GOAL LESS THAN 70)   [DISCONTINUED] RAPAFLO 4 MG CAPS capsule Take 1 tablet by mouth daily.   No facility-administered encounter medications on file as of 12/01/2022.    REVIEW OF SYSTEMS  : All other systems reviewed and negative except where noted in the History of Present Illness.   PHYSICAL EXAM: BP 110/70   Pulse 65   Ht 5\' 10"  (1.778 m)   Wt  173 lb (78.5 kg)   BMI 24.82 kg/m  General: Well developed white male in no acute distress Head: Normocephalic and atraumatic Eyes:  Sclerae anicteric, conjunctiva pink. Ears: Normal auditory acuity Lungs: Clear throughout to auscultation; no W/R/R. Heart: Regular rate and rhythm; no M/R/G. Musculoskeletal: Symmetrical with no gross deformities  Skin: No lesions on visible extremities Extremities: No edema  Neurological: Alert oriented x 4, grossly non-focal Psychological:  Alert and cooperative. Normal mood and affect  ASSESSMENT AND PLAN: *Dysphagia to solid food: Has been occurring and worsening over the past 3 to 4 months.  Describes this as being high up almost in the  back of his throat, however.  He does have a history of reflux and grade A esophagitis as seen on previous endoscopy in 2017.  Will plan for EGD with Jason. Russella Vaughn to evaluate for esophagitis versus stricture versus malignancy.  He just started on pantoprazole 40 mg daily a few days ago and should continue that for now.  The risks, benefits, and alternatives to EGD with dilation were discussed with the patient and he consents to proceed.  CC:  Jason Polka., Jason Vaughn

## 2022-12-05 ENCOUNTER — Ambulatory Visit (AMBULATORY_SURGERY_CENTER): Payer: Medicare Other | Admitting: Gastroenterology

## 2022-12-05 ENCOUNTER — Encounter: Payer: Self-pay | Admitting: Gastroenterology

## 2022-12-05 VITALS — BP 115/79 | HR 59 | Temp 97.9°F | Resp 12 | Ht 70.0 in | Wt 173.0 lb

## 2022-12-05 DIAGNOSIS — K3189 Other diseases of stomach and duodenum: Secondary | ICD-10-CM

## 2022-12-05 DIAGNOSIS — R1310 Dysphagia, unspecified: Secondary | ICD-10-CM

## 2022-12-05 DIAGNOSIS — K317 Polyp of stomach and duodenum: Secondary | ICD-10-CM

## 2022-12-05 DIAGNOSIS — K2100 Gastro-esophageal reflux disease with esophagitis, without bleeding: Secondary | ICD-10-CM | POA: Diagnosis not present

## 2022-12-05 MED ORDER — SODIUM CHLORIDE 0.9 % IV SOLN: 500.0000 mL | Freq: Once | INTRAVENOUS | Status: DC

## 2022-12-05 NOTE — Progress Notes (Signed)
VS completed by DT.  Pt's states no medical or surgical changes since previsit or office visit.  

## 2022-12-05 NOTE — Op Note (Addendum)
[facility] Patient Name: Jason Vaughn Procedure Date: 12/05/2022 9:59 AM MRN: [id number] Endoscopist: Meryl Dare , MD, [phone] Age: 67 Referring MD:  Date of Birth: 1955/09/24 Gender: Male Account #: [id number] Procedure:                Upper GI endoscopy Indications:              Dysphagia, Gastroesophageal reflux disease Medicines:                Monitored Anesthesia Care Procedure:                Pre-Anesthesia Assessment:                           - Prior to the procedure, a History and Physical                            was performed, and patient medications and                            allergies were reviewed. The patient's tolerance of                            previous anesthesia was also reviewed. The risks                            and benefits of the procedure and the sedation                            options and risks were discussed with the patient.                            All questions were answered, and informed consent                            was obtained. Prior Anticoagulants: The patient has                            taken no anticoagulant or antiplatelet agents. ASA                            Grade Assessment: II - A patient with mild systemic                            disease. After reviewing the risks and benefits,                            the patient was deemed in satisfactory condition to                            undergo the procedure.                           After obtaining informed consent, the endoscope was  passed under direct vision. Throughout the                            procedure, the patient's blood pressure, pulse, and                            oxygen saturations were monitored continuously. The                            GIF [id number] #[id number] was introduced through the                            mouth, and advanced to the second part of duodenum.                            The upper GI  endoscopy was accomplished without                            difficulty. The patient tolerated the procedure                            well. Scope In: Scope Out: Findings:                 LA Grade A (one or more mucosal breaks less than 5                            mm, not extending between tops of 2 mucosal folds)                            esophagitis with no bleeding was found at the                            gastroesophageal junction.                           The lumen of the proximal esophagus was mildly                            dilated.                           The exam of the esophagus was otherwise normal.                           No endoscopic abnormality was evident in the                            esophagus to explain the patient's complaint of                            dysphagia. It was decided, however, to proceed with                            dilation of the entire  esophagus. A guidewire was                            placed and the scope was withdrawn. Dilation was                            performed with a Savary dilator with no resistance                            at 17 mm.                           Multiple 3 to 5 mm sessile polyps with no bleeding                            and no stigmata of recent bleeding were found in                            the gastric fundus and in the gastric body.                            Biopsies were taken with a cold forceps for                            histology.                           The exam of the stomach was otherwise normal.                           A single 5 mm sessile polyp with no bleeding was                            found in the duodenal bulb. Biopsies were taken                            with a cold forceps for histology.                           The exam of the duodenum was otherwise normal. Complications:            No immediate complications. Estimated Blood Loss:     Estimated blood loss was  minimal. Impression:               - LA Grade A reflux esophagitis with no bleeding.                           - Dilation in the proximal esophagus.                           - No endoscopic esophageal abnormality to explain                            patient's dysphagia. Esophagus dilated.                           -  Multiple gastric polyps. Biopsied.                           - A single duodenal polyp. Biopsied. Recommendation:           - Patient has a contact number available for                            emergencies. The signs and symptoms of potential                            delayed complications were discussed with the                            patient. Return to normal activities tomorrow.                            Written discharge instructions were provided to the                            patient.                           - Clear liquid diet for 2 hours, then advance as                            tolerated to soft diet today.                           - Resume prior diet tomorrow.                           - Follow antirreflux measures long term.                           - Continue present medications including                            pantoprazole 40 mg po qd.                           - Await pathology results.                           - Barium esophagram at appointment to be scheduled. Meryl Dare, MD 12/05/2022 10:30:33 AM This report has been signed electronically.

## 2022-12-05 NOTE — Progress Notes (Signed)
See 12/01/2022 H&P no changes

## 2022-12-05 NOTE — Progress Notes (Signed)
Sedate, gd SR, tolerated procedure well, VSS, report to RN 

## 2022-12-05 NOTE — Patient Instructions (Addendum)
- Clear liquid diet for 2 hours, then advance as                            tolerated to soft diet today.                           - Resume prior diet tomorrow.                           - Follow antirreflux measures long term.                           - Continue present medications.                           - Await pathology results.                           - Barium esophagram at appointment to be scheduled.                     YOU HAD AN ENDOSCOPIC PROCEDURE TODAY AT THE [facility]:   Refer to the procedure report that was given to you for any specific questions about what was found during the examination.  If the procedure report does not answer your questions, please call your gastroenterologist to clarify.  If you requested that your care partner not be given the details of your procedure findings, then the procedure report has been included in a sealed envelope for you to review at your convenience later.  YOU SHOULD EXPECT: Some feelings of bloating in the abdomen. Passage of more gas than usual.  Walking can help get rid of the air that was put into your GI tract during the procedure and reduce the bloating. If you had a lower endoscopy (such as a colonoscopy or flexible sigmoidoscopy) you may notice spotting of blood in your stool or on the toilet paper. If you underwent a bowel prep for your procedure, you may not have a normal bowel movement for a few days.  Please Note:  You might notice some irritation and congestion in your nose or some drainage.  This is from the oxygen used during your procedure.  There is no need for concern and it should clear up in a day or so.  SYMPTOMS TO REPORT IMMEDIATELY:  Following upper endoscopy (EGD)  Vomiting of blood or coffee ground material  New chest pain or pain under the shoulder blades  Painful or persistently difficult swallowing  New shortness of breath  Fever of 100F or higher  Black,  tarry-looking stools  For urgent or emergent issues, a gastroenterologist can be reached at any hour by calling [phone]. Do not use MyChart messaging for urgent concerns.    DIET:  We do recommend a small meal at first, but then you may proceed to your regular diet.  Drink plenty of fluids but you should avoid alcoholic beverages for 24 hours.  ACTIVITY:  You should plan to take it easy for the rest of today and you should NOT DRIVE or use heavy machinery until tomorrow (because of the sedation medicines used during the test).    FOLLOW UP: Our staff will call the number listed on your records the  next business day following your procedure.  We will call around 7:15- 8:00 am to check on you and address any questions or concerns that you may have regarding the information given to you following your procedure. If we do not reach you, we will leave a message.     If any biopsies were taken you will be contacted by phone or by letter within the next 1-3 weeks.  Please call us at [phone] if you have not heard about the biopsies in 3 weeks.    SIGNATURES/CONFIDENTIALITY: You and/or your care partner have signed paperwork which will be entered into your electronic medical record.  These signatures attest to the fact that that the information above on your After Visit Summary has been reviewed and is understood.  Full responsibility of the confidentiality of this discharge information lies with you and/or your care-partner.

## 2022-12-05 NOTE — Progress Notes (Signed)
Called to room to assist during endoscopic procedure.  Patient ID and intended procedure confirmed with present staff. Received instructions for my participation in the procedure from the performing physician.  

## 2022-12-06 ENCOUNTER — Telehealth: Payer: Self-pay

## 2022-12-06 ENCOUNTER — Other Ambulatory Visit: Payer: Self-pay

## 2022-12-06 DIAGNOSIS — K2100 Gastro-esophageal reflux disease with esophagitis, without bleeding: Secondary | ICD-10-CM

## 2022-12-06 DIAGNOSIS — R1310 Dysphagia, unspecified: Secondary | ICD-10-CM

## 2022-12-06 NOTE — Telephone Encounter (Signed)
Per patient leave detailed voicemail. Left message explaining his barium esophogram is scheduled for [facility] (1st floor of the hospital) on 12/26/22 at 9:00am. Please arrive 30 minutes prior to your appointment for registration. Make certain not to have anything to eat or drink 3 hours prior to your test. If you need to reschedule for any reason, please contact radiology at [phone] to do so. ______________________________________ Informed patient to call back with any questions.

## 2022-12-06 NOTE — Telephone Encounter (Signed)
  Follow up Call-     12/05/2022    9:37 AM  Call back number  Post procedure Call Back phone  # [phone]  Permission to leave phone message Yes     Patient questions:  Do you have a fever, pain , or abdominal swelling? No. Pain Score  0 *  Have you tolerated food without any problems? Yes.    Have you been able to return to your normal activities? Yes.    Do you have any questions about your discharge instructions: Diet   No. Medications  No. Follow up visit  No.  Do you have questions or concerns about your Care? No.  Actions: * If pain score is 4 or above: No action needed, pain <4.

## 2022-12-09 ENCOUNTER — Telehealth: Payer: Self-pay | Admitting: Gastroenterology

## 2022-12-09 NOTE — Telephone Encounter (Signed)
The pt has been advised that the path report has not been reviewed as of this morning. He will be called as soon as able.

## 2022-12-09 NOTE — Telephone Encounter (Signed)
Inbound call from patient requesting to discuss next steps after endoscopy 8/19. States he would like to discuss how the polyps that were found would be monitored and how often he would need to have a endoscopy. Please advise, thank you.

## 2022-12-13 ENCOUNTER — Encounter: Payer: Self-pay | Admitting: Gastroenterology

## 2022-12-26 ENCOUNTER — Ambulatory Visit (HOSPITAL_COMMUNITY)
Admission: RE | Admit: 2022-12-26 | Discharge: 2022-12-26 | Disposition: A | Discharge status: Home / Self Care | Payer: Medicare Other | Source: Ambulatory Visit | Attending: Gastroenterology | Admitting: Gastroenterology

## 2022-12-26 DIAGNOSIS — R130 Aphagia: Secondary | ICD-10-CM | POA: Diagnosis not present

## 2022-12-26 DIAGNOSIS — R1310 Dysphagia, unspecified: Secondary | ICD-10-CM | POA: Insufficient documentation

## 2022-12-26 DIAGNOSIS — K219 Gastro-esophageal reflux disease without esophagitis: Secondary | ICD-10-CM | POA: Diagnosis not present

## 2022-12-26 DIAGNOSIS — K2100 Gastro-esophageal reflux disease with esophagitis, without bleeding: Secondary | ICD-10-CM | POA: Insufficient documentation

## 2022-12-29 DIAGNOSIS — Z85828 Personal history of other malignant neoplasm of skin: Secondary | ICD-10-CM | POA: Diagnosis not present

## 2022-12-29 DIAGNOSIS — L812 Freckles: Secondary | ICD-10-CM | POA: Diagnosis not present

## 2022-12-29 DIAGNOSIS — D485 Neoplasm of uncertain behavior of skin: Secondary | ICD-10-CM | POA: Diagnosis not present

## 2022-12-29 DIAGNOSIS — D225 Melanocytic nevi of trunk: Secondary | ICD-10-CM | POA: Diagnosis not present

## 2022-12-29 DIAGNOSIS — L820 Inflamed seborrheic keratosis: Secondary | ICD-10-CM | POA: Diagnosis not present

## 2023-02-23 DIAGNOSIS — K080 Exfoliation of teeth due to systemic causes: Secondary | ICD-10-CM | POA: Diagnosis not present

## 2023-03-24 DIAGNOSIS — M25572 Pain in left ankle and joints of left foot: Secondary | ICD-10-CM | POA: Diagnosis not present

## 2023-03-24 DIAGNOSIS — M79672 Pain in left foot: Secondary | ICD-10-CM | POA: Diagnosis not present

## 2023-04-16 DIAGNOSIS — M25572 Pain in left ankle and joints of left foot: Secondary | ICD-10-CM | POA: Diagnosis not present

## 2023-04-28 DIAGNOSIS — M216X2 Other acquired deformities of left foot: Secondary | ICD-10-CM | POA: Diagnosis not present

## 2023-04-28 DIAGNOSIS — M25572 Pain in left ankle and joints of left foot: Secondary | ICD-10-CM | POA: Diagnosis not present

## 2023-07-04 DIAGNOSIS — M1711 Unilateral primary osteoarthritis, right knee: Secondary | ICD-10-CM | POA: Diagnosis not present

## 2023-07-06 DIAGNOSIS — K080 Exfoliation of teeth due to systemic causes: Secondary | ICD-10-CM | POA: Diagnosis not present

## 2023-07-10 DIAGNOSIS — H52203 Unspecified astigmatism, bilateral: Secondary | ICD-10-CM | POA: Diagnosis not present

## 2023-10-18 DIAGNOSIS — K080 Exfoliation of teeth due to systemic causes: Secondary | ICD-10-CM | POA: Diagnosis not present

## 2023-10-19 DIAGNOSIS — M25532 Pain in left wrist: Secondary | ICD-10-CM | POA: Diagnosis not present

## 2024-01-11 DIAGNOSIS — K080 Exfoliation of teeth due to systemic causes: Secondary | ICD-10-CM | POA: Diagnosis not present

## 2024-01-18 DIAGNOSIS — Z85828 Personal history of other malignant neoplasm of skin: Secondary | ICD-10-CM | POA: Diagnosis not present

## 2024-01-18 DIAGNOSIS — L723 Sebaceous cyst: Secondary | ICD-10-CM | POA: Diagnosis not present

## 2024-01-18 DIAGNOSIS — D1801 Hemangioma of skin and subcutaneous tissue: Secondary | ICD-10-CM | POA: Diagnosis not present

## 2024-01-18 DIAGNOSIS — L570 Actinic keratosis: Secondary | ICD-10-CM | POA: Diagnosis not present

## 2024-01-18 DIAGNOSIS — L821 Other seborrheic keratosis: Secondary | ICD-10-CM | POA: Diagnosis not present

## 2024-01-22 DIAGNOSIS — R7301 Impaired fasting glucose: Secondary | ICD-10-CM | POA: Diagnosis not present

## 2024-01-22 DIAGNOSIS — Z0189 Encounter for other specified special examinations: Secondary | ICD-10-CM | POA: Diagnosis not present

## 2024-01-22 DIAGNOSIS — E785 Hyperlipidemia, unspecified: Secondary | ICD-10-CM | POA: Diagnosis not present

## 2024-01-22 DIAGNOSIS — Z125 Encounter for screening for malignant neoplasm of prostate: Secondary | ICD-10-CM | POA: Diagnosis not present

## 2024-01-29 DIAGNOSIS — Z23 Encounter for immunization: Secondary | ICD-10-CM | POA: Diagnosis not present

## 2024-01-29 DIAGNOSIS — N400 Enlarged prostate without lower urinary tract symptoms: Secondary | ICD-10-CM | POA: Diagnosis not present

## 2024-01-29 DIAGNOSIS — R82998 Other abnormal findings in urine: Secondary | ICD-10-CM | POA: Diagnosis not present

## 2024-01-29 DIAGNOSIS — Z148 Genetic carrier of other disease: Secondary | ICD-10-CM | POA: Diagnosis not present

## 2024-01-29 DIAGNOSIS — Z0000 Encounter for general adult medical examination without abnormal findings: Secondary | ICD-10-CM | POA: Diagnosis not present

## 2024-01-30 ENCOUNTER — Other Ambulatory Visit: Payer: Self-pay | Admitting: Orthopaedic Surgery

## 2024-01-30 DIAGNOSIS — M19011 Primary osteoarthritis, right shoulder: Secondary | ICD-10-CM | POA: Diagnosis not present

## 2024-01-30 DIAGNOSIS — M25512 Pain in left shoulder: Secondary | ICD-10-CM | POA: Diagnosis not present

## 2024-01-30 DIAGNOSIS — M19012 Primary osteoarthritis, left shoulder: Secondary | ICD-10-CM | POA: Diagnosis not present

## 2024-02-06 ENCOUNTER — Ambulatory Visit
Admission: RE | Admit: 2024-02-06 | Discharge: 2024-02-06 | Disposition: A | Source: Ambulatory Visit | Attending: Orthopaedic Surgery

## 2024-02-06 DIAGNOSIS — M19012 Primary osteoarthritis, left shoulder: Secondary | ICD-10-CM | POA: Diagnosis not present

## 2024-02-06 DIAGNOSIS — Z01818 Encounter for other preprocedural examination: Secondary | ICD-10-CM | POA: Diagnosis not present

## 2024-03-07 DIAGNOSIS — I2510 Atherosclerotic heart disease of native coronary artery without angina pectoris: Secondary | ICD-10-CM | POA: Diagnosis not present

## 2024-03-07 DIAGNOSIS — D1803 Hemangioma of intra-abdominal structures: Secondary | ICD-10-CM | POA: Diagnosis not present

## 2024-03-08 DIAGNOSIS — Z9889 Other specified postprocedural states: Secondary | ICD-10-CM | POA: Diagnosis not present

## 2024-03-08 DIAGNOSIS — M25562 Pain in left knee: Secondary | ICD-10-CM | POA: Diagnosis not present

## 2024-03-08 DIAGNOSIS — S83242A Other tear of medial meniscus, current injury, left knee, initial encounter: Secondary | ICD-10-CM | POA: Diagnosis not present

## 2024-03-11 NOTE — Progress Notes (Unsigned)
 Cardiology Office Note:   Date:  03/12/2024  ID:  Jason Vaughn, DOB [birth year], MRN [id number] PCP:  Loreli Elsie JONETTA Mickey., MD  [facility] Providers Cardiologist:  Wendel Haws, MD Referring MD: Loreli Elsie JONETTA Mickey., MD  Chief Complaint/Reason for Referral: Coronary artery calcification ASSESSMENT:    1. Shortness of breath   2. Coronary artery calcification seen on CAT scan   3. Hyperlipidemia LDL goal <70     PLAN:   In order of problems listed above: Dyspnea: Patient developed dyspnea when in [place] .  We will obtain a coronary CTA and echocardiogram to evaluate further.  If the patient has mild obstructive coronary artery disease, they will require a statin (with goal LDL < 70) and aspirin , if they have high-grade disease we will need to consider optimal medical therapy and if symptoms are refractory to medical therapy, then a cardiac catheterization with possible PCI will be pursued to alleviate symptoms.  If they have high risk disease we will proceed directly to cardiac catheterization.   Elevated coronary calcium: Start aspirin  81 mg, continue atorvastatin 20 mg, obtain echocardiogram.   Hyperlipidemia: Continue atorvastatin 20 mg, LDL in July was 55.  Patient had CRP and LP(a) checked last week.  He will communicate these results to me which will inform further management.           Dispo:  Return in about 6 months (around 09/09/2024).       I spent 42 minutes reviewing all clinical data during and prior to this visit including all relevant imaging studies, laboratories, clinical information from other health systems and prior notes from both Cardiology and other specialties, interviewing the patient, conducting a complete physical examination, and coordinating care in order to formulate a comprehensive and personalized evaluation and treatment plan.   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 The patient is a 68 year old male with a pelvis medical problems referred for recommendations regarding elevated coronary artery calcification seen on a calcium score CT.  This was obtained for screening purposes.  Of note the patient had evidence of a hepatic lesion and this is being evaluated as well.    He denies experiencing chest pain or shortness of breath during regular activities but notes mild shortness of breath when visiting his daughter in [place]  due to the mountainous terrain. He exercises regularly, walking five to six miles daily or using a treadmill for over an hour.  He reports a significant family history of heart disease, including an aunt who died of a heart attack, a mother who had a stent placed due to a 99% blockage, and a grandmother who underwent quadruple bypass surgery. His brother has coronary artery disease and is under the care of a cardiologist.  He experiences occasional lightheadedness, particularly when bending over and standing up, which he attributes to tamsulosin, a medication he is currently taking. No episodes of blacking out or significant bleeding.  He has a history of smoking during high school and college but quit at the age of 53. 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  He has been on atorvastatin for cholesterol management, initially at 10 mg and increased to 20 mg for the past five years. His recent lipid panel showed an  LDL level below 55. He recently had blood tests for LP(a) and CRP, but the results are not yet available.     Current Medications: Current Meds  Medication Sig   aspirin  EC 81 MG tablet Take 1 tablet (81 mg total) by mouth daily. Swallow whole.   atorvastatin (LIPITOR) 20 MG tablet Take 20 mg by mouth daily.   CIALIS 5 MG tablet TK  1 T PO QD   EPINEPHrine 0.3 mg/0.3 mL IJ SOAJ injection INJECT INTRAMUSCULARLY AS DIRECTED for 1   fluticasone  (FLONASE ) 50 MCG/ACT nasal spray Place 2 sprays into both nostrils daily.   metoprolol  tartrate (LOPRESSOR ) 25 MG tablet Take 1 tablet (25 mg total) by mouth once for 1 dose. Take 90-120 minutes prior to scan. Hold for SBP less than 110.   Multiple Vitamins-Minerals (MULTIVITAL) tablet Take 1 tablet by mouth daily.     naproxen sodium (ALEVE) 220 MG tablet Take 220 mg by mouth as needed.   tamsulosin (FLOMAX) 0.4 MG CAPS capsule Take by mouth.     Review of Systems:   Please see the history of present illness.    All other systems reviewed and are negative.     EKGs/Labs/Other Test Reviewed:   EKG: 2015 sinus bradycardia  EKG Interpretation Date/Time:  Tuesday March 12 2024 10:46:11 EST Ventricular Rate:  57 PR Interval:  150 QRS Duration:  92 QT Interval:  424 QTC Calculation: 412 R Axis:   96  Text Interpretation: Sinus bradycardia Rightward axis Abnormal QRS-T angle, consider primary T wave abnormality No previous ECGs available Confirmed by Wendel Haws ([id number]) on 03/12/2024 10:53:16 AM        CARDIAC STUDIES: Refer to CV Procedures and Imaging Tabs   Risk Assessment/Calculations:          Physical Exam:   VS:  BP 120/80 (BP Location: Right Arm, Patient Position: Sitting, Cuff Size: Normal)   Pulse (!) 57   Ht 5' 10 (1.778 m)   Wt 177 lb 6.4 oz (80.5 kg)   SpO2 98%   BMI 25.45 kg/m        Wt Readings from Last 3 Encounters:  03/12/24 177 lb 6.4 oz (80.5 kg)  12/05/22 173 lb (78.5 kg)  12/01/22 173 lb (78.5 kg)      GENERAL:  No apparent distress, AOx3 HEENT:  No carotid bruits, +2 carotid impulses, no scleral icterus CAR: RRR no murmurs, gallops, rubs, or thrills RES:  Clear to auscultation bilaterally ABD:  Soft, nontender, nondistended, positive bowel sounds x 4 VASC:  +2 radial pulses, +2 carotid pulses NEURO:  CN 2-12 grossly intact;  motor and sensory grossly intact PSYCH:  No active depression or anxiety EXT:  No edema, ecchymosis, or cyanosis  Signed, Rasaan Brotherton K Navid Lenzen, MD  03/12/2024 11:17 AM    [facility] [address] [place], [place], [place]  [address] Phone: [phone]; Fax: [phone]   Note:  This document was prepared using Dragon voice recognition software and may include unintentional dictation errors.

## 2024-03-12 ENCOUNTER — Encounter: Payer: Self-pay | Admitting: Internal Medicine

## 2024-03-12 ENCOUNTER — Ambulatory Visit: Attending: Internal Medicine | Admitting: Internal Medicine

## 2024-03-12 VITALS — BP 120/80 | HR 57 | Ht 70.0 in | Wt 177.4 lb

## 2024-03-12 DIAGNOSIS — E785 Hyperlipidemia, unspecified: Secondary | ICD-10-CM | POA: Diagnosis not present

## 2024-03-12 DIAGNOSIS — I2510 Atherosclerotic heart disease of native coronary artery without angina pectoris: Secondary | ICD-10-CM

## 2024-03-12 DIAGNOSIS — R0602 Shortness of breath: Secondary | ICD-10-CM | POA: Diagnosis not present

## 2024-03-12 MED ORDER — METOPROLOL TARTRATE 25 MG PO TABS: 25.0000 mg | ORAL_TABLET | Freq: Once | ORAL | 0 refills | Status: AC

## 2024-03-12 MED ORDER — ASPIRIN 81 MG PO TBEC: 81.0000 mg | DELAYED_RELEASE_TABLET | Freq: Every day | ORAL | Status: DC

## 2024-03-12 NOTE — Patient Instructions (Addendum)
 Medication Instructions:  START Aspirin  81 mg once daily  ONCE Metoprolol  Tartrate (Lopressor ) 25 mg two hour prior to coronary CTA scan  *If you need a refill on your cardiac medications before your next appointment, please call your pharmacy*  Lab Work: To be completed today: BMP  If you have labs (blood work) drawn today and your tests are completely normal, you will receive your results only by: MyChart Message (if you have MyChart) OR A paper copy in the mail If you have any lab test that is abnormal or we need to change your treatment, we will call you to review the results.  Testing/Procedures: Your physician has requested that you have an echocardiogram. Echocardiography is a painless test that uses sound waves to create images of your heart. It provides your doctor with information about the size and shape of your heart and how well your heart's chambers and valves are working. This procedure takes approximately one hour. There are no restrictions for this procedure. Please do NOT wear cologne, perfume, aftershave, or lotions (deodorant is allowed). Please arrive 15 minutes prior to your appointment time.  Please note: We ask at that you not bring children with you during ultrasound (echo/ vascular) testing. Due to room size and safety concerns, children are not allowed in the ultrasound rooms during exams. Our front office staff cannot provide observation of children in our lobby area while testing is being conducted. An adult accompanying a patient to their appointment will only be allowed in the ultrasound room at the discretion of the ultrasound technician under special circumstances. We apologize for any inconvenience.   Follow-Up: At [facility], you and your health needs are our priority.  As part of our continuing mission to provide you with exceptional heart care, our providers are all part of one team.  This team includes your primary Cardiologist (physician) and  Advanced Practice Providers or APPs (Physician Assistants and Nurse Practitioners) who all work together to provide you with the care you need, when you need it.  Your next appointment:   6 month(s)  Provider:   Lurena Red, MD        Your cardiac CT will be scheduled at one of the below locations:   [facility] [address] [place], [place] [address] [phone]  Please follow these instructions carefully (unless otherwise directed):  An IV will be required for this test and Nitroglycerin will be given.  Hold all erectile dysfunction medications at least 3 days (72 hrs) prior to test. (Ie viagra, cialis, sildenafil, tadalafil, etc)   On the Night Before the Test: Be sure to Drink plenty of water. Do not consume any caffeinated/decaffeinated beverages or chocolate 12 hours prior to your test. Do not take any antihistamines 12 hours prior to your test.  On the Day of the Test: Drink plenty of water until 1 hour prior to the test. Do not eat any food 1 hour prior to test. You may take your regular medications prior to the test.  Take metoprolol  (Lopressor ) 25 mg two hours prior to test.      After the Test: Drink plenty of water. After receiving IV contrast, you may experience a mild flushed feeling. This is normal. On occasion, you may experience a mild rash up to 24 hours after the test. This is not dangerous. If this occurs, you can take Benadryl 25 mg and increase your fluid intake. If you experience trouble breathing, this can be serious. If it is  severe call 911 IMMEDIATELY. If it is mild, please call our office.  We will call to schedule your test 2-4 weeks out understanding that some insurance companies will need an authorization prior to the service being performed.   For more information and frequently asked questions, please visit our website : [email or web address]  For non-scheduling related questions, please contact the cardiac  imaging nurse navigator should you have any questions/concerns: Cardiac Imaging Nurse Navigators Direct Office Dial: [phone]   For scheduling needs, including cancellations and rescheduling, please call Brittany, [phone].

## 2024-03-13 ENCOUNTER — Ambulatory Visit: Payer: Self-pay | Admitting: Internal Medicine

## 2024-03-13 ENCOUNTER — Ambulatory Visit (AMBULATORY_SURGERY_CENTER)

## 2024-03-13 ENCOUNTER — Encounter: Payer: Self-pay | Admitting: Gastroenterology

## 2024-03-13 VITALS — Ht 70.0 in | Wt 175.0 lb

## 2024-03-13 DIAGNOSIS — Z1211 Encounter for screening for malignant neoplasm of colon: Secondary | ICD-10-CM

## 2024-03-13 LAB — BASIC METABOLIC PANEL WITH GFR
BUN/Creatinine Ratio: 25 — ABNORMAL HIGH (ref 10–24)
BUN: 29 mg/dL — ABNORMAL HIGH (ref 8–27)
CO2: 23 mmol/L (ref 20–29)
Calcium: 9.6 mg/dL (ref 8.6–10.2)
Chloride: 102 mmol/L (ref 96–106)
Creatinine, Ser: 1.16 mg/dL (ref 0.76–1.27)
Glucose: 96 mg/dL (ref 70–99)
Potassium: 4.3 mmol/L (ref 3.5–5.2)
Sodium: 141 mmol/L (ref 134–144)
eGFR: 69 mL/min/1.73 (ref >59)

## 2024-03-13 MED ORDER — NA SULFATE-K SULFATE-MG SULF 17.5-3.13-1.6 GM/177ML PO SOLN: 1.0000 | Freq: Once | ORAL | 0 refills | Status: AC

## 2024-03-13 NOTE — Progress Notes (Signed)
 No egg or soy allergy known to patient  No issues known to pt with past sedation with any surgeries or procedures Patient denies ever being told they had issues or difficulty with intubation  No FH of Malignant Hyperthermia Pt is not on diet pills Pt is not on  home 02  Pt is not on blood thinners  Pt denies issues with constipation  No A fib or A flutter  Have any cardiac testing pending--YES, Sinus Brady per pt, Echo and CT scheduled.  Pt can ambulate-Independently  Pt denies use of chewing tobacco Discussed diabetic I weight loss medication holds Discussed NSAID holds Checked BMI Pt instructed to use Singlecare.com or GoodRx for a price reduction on prep  Patient's chart reviewed by Norleen Schillings CNRA prior to previsit and patient appropriate for the [facility].  Pre visit completed and red dot placed by patient's name on their procedure day (on provider's schedule).

## 2024-03-25 DIAGNOSIS — M25562 Pain in left knee: Secondary | ICD-10-CM | POA: Diagnosis not present

## 2024-03-29 ENCOUNTER — Ambulatory Visit: Admitting: Gastroenterology

## 2024-03-29 ENCOUNTER — Encounter: Payer: Self-pay | Admitting: Gastroenterology

## 2024-03-29 VITALS — BP 116/77 | HR 56 | Temp 98.1°F | Resp 10 | Ht 70.0 in | Wt 175.0 lb

## 2024-03-29 DIAGNOSIS — K5730 Diverticulosis of large intestine without perforation or abscess without bleeding: Secondary | ICD-10-CM | POA: Diagnosis not present

## 2024-03-29 DIAGNOSIS — D123 Benign neoplasm of transverse colon: Secondary | ICD-10-CM

## 2024-03-29 DIAGNOSIS — Z1211 Encounter for screening for malignant neoplasm of colon: Secondary | ICD-10-CM | POA: Diagnosis not present

## 2024-03-29 MED ORDER — SODIUM CHLORIDE 0.9 % IV SOLN: 500.0000 mL | Freq: Once | INTRAVENOUS | Status: DC

## 2024-03-29 NOTE — Progress Notes (Signed)
 Vss nad trans to pacu

## 2024-03-29 NOTE — Progress Notes (Signed)
 Called to room to assist during endoscopic procedure.  Patient ID and intended procedure confirmed with present staff. Received instructions for my participation in the procedure from the performing physician.

## 2024-03-29 NOTE — Patient Instructions (Addendum)
Resume previous diet. Continue present medications. Await pathology results. Repeat colonoscopy (date not yet determined) for surveillance based on pathology results.  YOU HAD AN ENDOSCOPIC PROCEDURE TODAY AT THE [facility]:   Refer to the procedure report that was given to you for any specific questions about what was found during the examination.  If the procedure report does not answer your questions, please call your gastroenterologist to clarify.  If you requested that your care partner not be given the details of your procedure findings, then the procedure report has been included in a sealed envelope for you to review at your convenience later.  YOU SHOULD EXPECT: Some feelings of bloating in the abdomen. Passage of more gas than usual.  Walking can help get rid of the air that was put into your GI tract during the procedure and reduce the bloating. If you had a lower endoscopy (such as a colonoscopy or flexible sigmoidoscopy) you may notice spotting of blood in your stool or on the toilet paper. If you underwent a bowel prep for your procedure, you may not have a normal bowel movement for a few days.  Please Note:  You might notice some irritation and congestion in your nose or some drainage.  This is from the oxygen used during your procedure.  There is no need for concern and it should clear up in a day or so.  SYMPTOMS TO REPORT IMMEDIATELY:  Following lower endoscopy (colonoscopy or flexible sigmoidoscopy):  Excessive amounts of blood in the stool  Significant tenderness or worsening of abdominal pains  Swelling of the abdomen that is new, acute  Fever of 100F or higher  For urgent or emergent issues, a gastroenterologist can be reached at any hour by calling [phone]. Do not use MyChart messaging for urgent concerns.    DIET:  We do recommend a small meal at first, but then you may proceed to your regular diet.  Drink plenty of fluids but you should avoid  alcoholic beverages for 24 hours.  ACTIVITY:  You should plan to take it easy for the rest of today and you should NOT DRIVE or use heavy machinery until tomorrow (because of the sedation medicines used during the test).    FOLLOW UP: Our staff will call the number listed on your records the next business day following your procedure.  We will call around 7:15- 8:00 am to check on you and address any questions or concerns that you may have regarding the information given to you following your procedure. If we do not reach you, we will leave a message.     If any biopsies were taken you will be contacted by phone or by letter within the next 1-3 weeks.  Please call us at [phone] if you have not heard about the biopsies in 3 weeks.    SIGNATURES/CONFIDENTIALITY: You and/or your care partner have signed paperwork which will be entered into your electronic medical record.  These signatures attest to the fact that that the information above on your After Visit Summary has been reviewed and is understood.  Full responsibility of the confidentiality of this discharge information lies with you and/or your care-partner. 

## 2024-03-29 NOTE — Progress Notes (Signed)
 [facility] History and Physical   Primary Care Physician:  Loreli Elsie JONETTA Mickey., MD   Reason for Procedure:   Colon cancer screening  Plan:    Screening colonoscopy   HPI: Jason Vaughn is a 68 y.o. male undergoing average risk screening colonoscopy.  He has no family history of colon cancer and no chronic GI symptoms.   He had normal colonoscopies in 2005 and 2015 (left sided diverticulosis noted)  The patient was provided an opportunity to ask questions and all were answered. 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   Diverticulosis of colon    GERD (gastroesophageal reflux disease)    Hyperlipidemia    Prostatitis    Skin cancer    skin: basal & squamous cell   Tear of MCL (medial collateral ligament) of knee    [facility]    Past Surgical History:  Procedure Laterality Date   COLONOSCOPY  2005 & 2015   diverticulosis; Dr Debrah FONTANA HERNIA REPAIR Bilateral 11/10/2014   KNEE ARTHROSCOPY WITH ANTERIOR CRUCIATE LIGAMENT (ACL) REPAIR     2023   PROSTATE SURGERY  2020   SHOULDER SURGERY Bilateral 04/18/2014   UPPER GASTROINTESTINAL ENDOSCOPY      Prior to Admission medications  Medication Sig Start Date End Date Taking? Authorizing Provider  aspirin  EC 81 MG tablet Take 1 tablet (81 mg total) by mouth daily. Swallow whole. 03/12/24  Yes Thukkani, Arun K, MD  atorvastatin (LIPITOR) 20 MG tablet Take 20 mg by mouth daily.   Yes [provider]  CIALIS 5 MG tablet TK 1 T PO QD 12/11/14  Yes [provider]  tamsulosin (FLOMAX) 0.4 MG CAPS capsule Take by mouth. 05/30/17  Yes [provider]  EPINEPHrine 0.3 mg/0.3 mL IJ SOAJ injection INJECT INTRAMUSCULARLY AS DIRECTED for 1    [provider]  fluticasone  (FLONASE ) 50 MCG/ACT nasal spray Place 2 sprays into both nostrils  daily. Patient not taking: Reported on 03/13/2024 04/08/13   Antonio Cyndee Rockers R, DO  metoprolol  tartrate (LOPRESSOR ) 25 MG tablet Take 1 tablet (25 mg total) by mouth once for 1 dose. Take 90-120 minutes prior to scan. Hold for SBP less than 110. Patient not taking: Reported on 03/13/2024 03/12/24 03/12/24  Thukkani, Arun K, MD  Multiple Vitamins-Minerals (MULTIVITAL) tablet Take 1 tablet by mouth daily.   Patient not taking: Reported on 03/29/2024    [provider]  naproxen sodium (ALEVE) 220 MG tablet Take 220 mg by mouth as needed.    [provider]    Current Outpatient Medications  Medication Sig Dispense Refill   aspirin  EC 81 MG tablet Take 1 tablet (81 mg total) by mouth daily. Swallow whole.     atorvastatin (LIPITOR) 20 MG tablet Take 20 mg by mouth daily.     CIALIS 5 MG tablet TK 1 T PO QD  11   tamsulosin (FLOMAX) 0.4 MG CAPS capsule Take by mouth.     EPINEPHrine 0.3 mg/0.3 mL IJ SOAJ injection INJECT INTRAMUSCULARLY AS DIRECTED for 1     fluticasone  (FLONASE ) 50 MCG/ACT nasal spray Place 2 sprays into both nostrils daily. (Patient not taking: Reported on 03/13/2024) 16 g 1   metoprolol  tartrate (LOPRESSOR ) 25 MG tablet Take 1 tablet (25 mg total) by mouth  once for 1 dose. Take 90-120 minutes prior to scan. Hold for SBP less than 110. (Patient not taking: Reported on 03/13/2024) 1 tablet 0   Multiple Vitamins-Minerals (MULTIVITAL) tablet Take 1 tablet by mouth daily.   (Patient not taking: Reported on 03/29/2024)     naproxen sodium (ALEVE) 220 MG tablet Take 220 mg by mouth as needed.     Current Facility-Administered Medications  Medication Dose Route Frequency Provider Last Rate Last Admin   0.9 %  sodium chloride  infusion  500 mL Intravenous Once Stacia Glendia BRAVO, MD        Allergies as of 03/29/2024 - Review Complete 03/29/2024  Allergen Reaction Noted   Bee venom Anaphylaxis and Swelling 06/17/2015   Other Other (See Comments) 07/01/2014     Family History  Problem Relation Age of Onset   Coronary artery disease Mother        stent @ 3   Lung cancer Father    Eosinophilic granuloma Father    Hypertension Brother    Mental illness Brother        bipolar disorder comittied suicide   Breast cancer Maternal Grandmother    Heart attack Maternal Grandmother        early 7s   Alzheimer's disease Maternal Grandmother    Heart attack Maternal Grandfather 42       MI   Cancer Paternal Grandmother        LYMPHOMA   Heart attack Other 45       MGuncle   Diabetes Neg Hx    Stroke Neg Hx    Colon cancer Neg Hx    Esophageal cancer Neg Hx    Rectal cancer Neg Hx    Stomach cancer Neg Hx     Social History   Socioeconomic History   Marital status: Single    Spouse name: Not on file   Number of children: 2   Years of education: Not on file   Highest education level: Not on file  Occupational History   Occupation: sales  Tobacco Use   Smoking status: Former    Current packs/day: 0.00    Types: Cigarettes    Quit date: 04/18/1980    Years since quitting: 43.9   Smokeless tobacco: Never   Tobacco comments:    smoked ages [age] up to < 1 ppd  Vaping Use   Vaping status: Never Used  Substance and Sexual Activity   Alcohol  use: Yes    Alcohol /week: 7.0 standard drinks of alcohol     Types: 7 Standard drinks or equivalent per week    Comment: limited   Drug use: No   Sexual activity: Not on file  Other Topics Concern   Not on file  Social History Narrative   Not on file   Social Drivers of Health   Tobacco Use: Medium Risk (03/29/2024)   Patient History    Smoking Tobacco Use: Former    Smokeless Tobacco Use: Never    Passive Exposure: Not on Actuary Strain: Not on file  Food Insecurity: Not on file  Transportation Needs: Not on file  Physical Activity: Not on file  Stress: Not on file  Social Connections: Not on file  Intimate Partner Violence: Not on file  Depression (EYV7-0): Not  on file  Alcohol  Screen: Not on file  Housing: Not on file  Utilities: Not on file  Health Literacy: Not on file    Review of Systems:  All other review of systems  negative except as mentioned in the HPI.  Physical Exam: Vital signs BP 127/80   Pulse 71   Temp 98.1 F (36.7 C) (Temporal)   Ht 5' 10 (1.778 m)   Wt 175 lb (79.4 kg)   SpO2 95%   BMI 25.11 kg/m   General:   Alert,  Well-developed, well-nourished, pleasant and cooperative in NAD Airway:  Mallampati 2 Lungs:  Clear throughout to auscultation.   Heart:  Regular rate and rhythm; no murmurs, clicks, rubs,  or gallops. Abdomen:  Soft, nontender and nondistended. Normal bowel sounds.   Neuro/Psych:  Normal mood and affect. A and O x 3   Tresean Mattix E. Stacia, MD [facility]

## 2024-03-29 NOTE — Op Note (Signed)
  [facility] Patient Name: Jason Vaughn Procedure Date: 03/29/2024 8:00 AM MRN: [id number] Endoscopist: Glendia E. Stacia , MD, [id number] Age: 68 Referring MD:  Date of Birth: Jun 29, 1955 Gender: Male Account #: [id number] Procedure:                Colonoscopy Indications:              Screening for colorectal malignant neoplasm (last                            colonoscopy was 10 years ago) Medicines:                Monitored Anesthesia Care Procedure:                Pre-Anesthesia Assessment:                           - Prior to the procedure, a History and Physical                            was performed, and patient medications and                            allergies were reviewed. The patient's tolerance of                            previous anesthesia was also reviewed. The risks                            and benefits of the procedure and the sedation                            options and risks were discussed with the patient.                            All questions were answered, and informed consent                            was obtained. Prior Anticoagulants: The patient has                            taken no anticoagulant or antiplatelet agents. ASA                            Grade Assessment: II - A patient with mild systemic                            disease. After reviewing the risks and benefits,                            the patient was deemed in satisfactory condition to                            undergo the procedure.  After obtaining informed consent, the colonoscope                            was passed under direct vision. Throughout the                            procedure, the patient's blood pressure, pulse, and                            oxygen saturations were monitored continuously. The                            CF [id number] #[id number] was introduced through the anus                            and advanced to the  the cecum, identified by                            appendiceal orifice and ileocecal valve. The                            colonoscopy was performed without difficulty. The                            patient tolerated the procedure well. The quality                            of the bowel preparation was good. The ileocecal                            valve, appendiceal orifice, and rectum were                            photographed. The bowel preparation used was SUPREP                            via split dose instruction. Scope In: 8:03:10 AM Scope Out: 8:22:22 AM Scope Withdrawal Time: 0 hours 13 minutes 29 seconds  Total Procedure Duration: 0 hours 19 minutes 12 seconds  Findings:                 The perianal and digital rectal examinations were                            normal. Pertinent negatives include normal                            sphincter tone and no palpable rectal lesions.                           A 4 mm polyp was found in the transverse colon. The                            polyp was sessile. The polyp was removed  with a                            cold snare. Resection and retrieval were complete.                            Estimated blood loss was minimal.                           A few small-mouthed diverticula were found in the                            sigmoid colon.                           The exam was otherwise normal throughout the                            examined colon.                           The retroflexed view of the distal rectum and anal                            verge was normal and showed no anal or rectal                            abnormalities. Complications:            No immediate complications. Estimated Blood Loss:     Estimated blood loss was minimal. Impression:               - One 4 mm polyp in the transverse colon, removed                            with a cold snare. Resected and retrieved.                           - Mild  diverticulosis in the sigmoid colon.                           - The distal rectum and anal verge are normal on                            retroflexion view. Recommendation:           - Patient has a contact number available for                            emergencies. The signs and symptoms of potential                            delayed complications were discussed with the                            patient. Return to normal activities tomorrow.  Written discharge instructions were provided to the                            patient.                           - Resume previous diet.                           - Continue present medications.                           - Await pathology results.                           - Repeat colonoscopy (date not yet determined) for                            surveillance based on pathology results. Dillan Lunden E. Stacia, MD 03/29/2024 8:29:21 AM This report has been signed electronically.

## 2024-03-30 ENCOUNTER — Encounter: Payer: Self-pay | Admitting: Gastroenterology

## 2024-04-01 ENCOUNTER — Telehealth: Payer: Self-pay

## 2024-04-01 NOTE — Telephone Encounter (Signed)
°  Follow up Call-     03/29/2024    7:13 AM 12/05/2022    9:37 AM  Call back number  Post procedure Call Back phone  # [phone]  Permission to leave phone message Yes Yes     Patient questions:  Do you have a fever, pain , or abdominal swelling? No. Pain Score  0 *  Have you tolerated food without any problems? Yes.    Have you been able to return to your normal activities? Yes.    Do you have any questions about your discharge instructions: Diet   No. Medications  No. Follow up visit  No.  Do you have questions or concerns about your Care? No.  Actions: * If pain score is 4 or above: No action needed, pain <4.

## 2024-04-02 ENCOUNTER — Telehealth (HOSPITAL_BASED_OUTPATIENT_CLINIC_OR_DEPARTMENT_OTHER): Payer: Self-pay

## 2024-04-02 ENCOUNTER — Telehealth (HOSPITAL_COMMUNITY): Payer: Self-pay | Admitting: *Deleted

## 2024-04-02 DIAGNOSIS — S83242A Other tear of medial meniscus, current injury, left knee, initial encounter: Secondary | ICD-10-CM | POA: Diagnosis not present

## 2024-04-02 DIAGNOSIS — S83232A Complex tear of medial meniscus, current injury, left knee, initial encounter: Secondary | ICD-10-CM | POA: Diagnosis not present

## 2024-04-02 NOTE — Telephone Encounter (Signed)
 Reaching out to patient to offer assistance regarding upcoming cardiac imaging study; pt verbalizes understanding of appt date/time, parking situation and where to check in, pre-test NPO status, and verified current allergies; name and call back number provided for further questions should they arise  Larey Brick RN Navigator Cardiac Imaging [facility] [phone] office [phone] cell

## 2024-04-02 NOTE — Telephone Encounter (Signed)
° °  Pre-operative Risk Assessment    Patient Name: Jason Vaughn  DOB: [birth year] MRN: [id number]   Date of last office visit: 03/12/24 Dr. Wendel Date of next office visit: na  Request for Surgical Clearance    Procedure:  Left Knee Scope  Date of Surgery:  Clearance TBD                                 Surgeon:  Dr. Cristy  Surgeon's Group or Practice Name:  [facility] Phone number:  [phone] Fax number:  [phone]   Type of Clearance Requested:   - Medical  - Pharmacy:  Hold Aspirin  not indicated   Type of Anesthesia:  General    Additional requests/questions:    SignedAugustin JONETTA Daring   04/02/2024, 9:45 AM

## 2024-04-03 ENCOUNTER — Ambulatory Visit (HOSPITAL_COMMUNITY): Admission: RE | Admit: 2024-04-03 | Discharge: 2024-04-03 | Attending: Cardiology | Admitting: Cardiology

## 2024-04-03 DIAGNOSIS — R0602 Shortness of breath: Secondary | ICD-10-CM | POA: Diagnosis not present

## 2024-04-03 DIAGNOSIS — I2510 Atherosclerotic heart disease of native coronary artery without angina pectoris: Secondary | ICD-10-CM | POA: Diagnosis not present

## 2024-04-03 LAB — SURGICAL PATHOLOGY

## 2024-04-03 MED ORDER — IOHEXOL 350 MG/ML SOLN
100.0000 mL | Freq: Once | INTRAVENOUS | Status: AC | PRN
  Administered 2024-04-03: 08:00:00 100 mL via INTRAVENOUS

## 2024-04-03 MED ORDER — NITROGLYCERIN 0.4 MG SL SUBL
0.8000 mg | SUBLINGUAL_TABLET | Freq: Once | SUBLINGUAL | Status: AC
  Administered 2024-04-03: 08:00:00 0.8 mg via SUBLINGUAL

## 2024-04-08 ENCOUNTER — Ambulatory Visit: Payer: Self-pay | Admitting: Gastroenterology

## 2024-04-08 NOTE — Progress Notes (Signed)
 Mr. Jason Vaughn,  The polyp which I removed during your recent procedure was proven to be completely benign but is considered a pre-cancerous polyp that MAY have grown into cancer if it had not been removed.  Studies shows that at least 20% of women over age 66 and 30% of men over age 80 have pre-cancerous polyps.  Based on current nationally recognized surveillance guidelines, I recommend that you have a repeat colonoscopy in 7 years.   If you develop any new rectal bleeding, abdominal pain or significant bowel habit changes, please contact me before then.

## 2024-04-15 ENCOUNTER — Encounter: Payer: Self-pay | Admitting: Medical

## 2024-04-15 ENCOUNTER — Ambulatory Visit: Attending: Medical | Admitting: Medical

## 2024-04-15 VITALS — BP 112/60 | HR 73 | Ht 70.0 in | Wt 178.2 lb

## 2024-04-15 DIAGNOSIS — Z01810 Encounter for preprocedural cardiovascular examination: Secondary | ICD-10-CM

## 2024-04-15 DIAGNOSIS — R0600 Dyspnea, unspecified: Secondary | ICD-10-CM

## 2024-04-15 DIAGNOSIS — I2510 Atherosclerotic heart disease of native coronary artery without angina pectoris: Secondary | ICD-10-CM

## 2024-04-15 DIAGNOSIS — E785 Hyperlipidemia, unspecified: Secondary | ICD-10-CM | POA: Diagnosis not present

## 2024-04-15 NOTE — Progress Notes (Signed)
" °  Cardiology Office Note   Date:  04/15/2024  ID:  Cyler, Kappes [birth year], MRN [id number] PCP: Loreli Elsie JONETTA Mickey., MD  [facility] Providers Cardiologist:  None   History of Present Illness Jason Vaughn is a 68 y.o. male with a h/o nonobstructive CAD, HLD, GERD who presents for follow-up of Cardiac CTA.   He was seen 02/2024 for DOE. Cardiac CTA showed mild nonobstructive CAD. Echo was ordered, but has not been performed.   Today, he is needing pre-operative evaluation for left meniscus surgery. It's been injured overtime, maybe playing golf. Echo was ordered when he had DOE in [place] , it is scheduled for next month. He denies chest pain. He  still has minor SOB on exertion. He can walk 1-2 blocks, up a flight of stairs. He does 60 min of cardio daily.   Studies Reviewed EKG Interpretation Date/Time:  Monday April 15 2024 15:17:33 EST Ventricular Rate:  73 PR Interval:  136 QRS Duration:  88 QT Interval:  412 QTC Calculation: 453 R Axis:   29  Text Interpretation: Normal sinus rhythm Normal ECG When compared with ECG of 12-Mar-2024 10:46, Questionable change in QRS axis Nonspecific T wave abnormality has replaced inverted T waves in Inferior leads Confirmed by Franchester, Makenize Messman ([id number]) on 04/15/2024 3:38:48 PM    Cardiac CTA 03/2024 IMPRESSION: 1. Coronary calcium score 576 AU. This places the patient in the 83rd percentile for age and gender, suggesting high risk for future cardiac events.   2.  Mild nonobstructive CAD.     Physical Exam VS:  BP 112/60 (BP Location: Left Arm, Patient Position: Sitting, Cuff Size: Normal)   Pulse 73   Ht 5' 10 (1.778 m)   Wt 178 lb 4 oz (80.9 kg)   SpO2 98%   BMI 25.58 kg/m        Wt Readings from Last 3 Encounters:  04/15/24 178 lb 4 oz (80.9 kg)  03/29/24 175 lb (79.4 kg)  03/13/24 175 lb (79.4 kg)    GEN: Well nourished, well developed in no acute distress NECK: No JVD; No carotid bruits CARDIAC: RRR,  no murmurs, rubs, gallops RESPIRATORY:  Clear to auscultation without rales, wheezing or rhonchi  ABDOMEN: Soft, non-tender, non-distended EXTREMITIES:  No edema; No deformity   ASSESSMENT AND PLAN  DOE He has minimal DOE. He does cardio 60 minutes daily and is overall very active. He is euvolemic on exam. He is scheduled for an echo next month, we will try and get it ASAP/prior to surgery if possible.  Nonobstructive CAD Cardiac CTA showed nonobstructive CAD. He denies chest pain, but has DOE as above. Continue ASA and Lipitor 20mg  daily.   HLD LDL 48 by patient reports. Continue Lipitor 20mg  daily.   Pre-operative evaluation The patient is needing L meniscus repair, and is wanting to schedule this for next month.  Cardiac CTA showed nonobstructive CAD.  Patient reports minimal dyspnea on exertion and has an echocardiogram scheduled for next month.  The patient does 60 minutes of cardio daily and is overall very active.  Echo should not delay surgery, but we will try and get it ASAP.  Okay to hold aspirin  per surgeons instructions. METs greater than 4 RCRI = 0.5% risk of MACE     Dispo: Follow-up in 6 months  Signed, Zofia Peckinpaugh VEAR Franchester, PA-C   "

## 2024-04-15 NOTE — Patient Instructions (Signed)
 Medication Instructions:  Your physician recommends that you continue on your current medications as directed. Please refer to the Current Medication list given to you today.    *If you need a refill on your cardiac medications before your next appointment, please call your pharmacy*  Lab Work: No labs ordered today   Testing/Procedures: No test ordered today   Follow-Up: At [facility], you and your health needs are our priority.  As part of our continuing mission to provide you with exceptional heart care, our providers are all part of one team.  This team includes your primary Cardiologist (physician) and Advanced Practice Providers or APPs (Physician Assistants and Nurse Practitioners) who all work together to provide you with the care you need, when you need it.  Your next appointment:   6 month(s)  Provider:   Toribio Frees, PA-C

## 2024-04-15 NOTE — Telephone Encounter (Signed)
 Will forward to preop APP pt has apt today with Cadence Franchester, PAC. Will confirm if Dr. Mclean appt still needed for preop.

## 2024-04-15 NOTE — Telephone Encounter (Signed)
 Appointment is scheduled for 04/15/24 @ 3:35pm. Patient asked for something as soon as possible, could not do the 04/25/23 with A Wendel, MD.

## 2024-04-15 NOTE — Telephone Encounter (Signed)
 Yes can keep the appointment with Jason Vaughn.  No need for appt with Dr. Rolan unless Candace would like to consult with him.

## 2024-04-15 NOTE — Telephone Encounter (Signed)
 Coronary CTA was completed on 04/03/2024.  It was abnormal.    Per Dr.McLean 12/17/20255 IMPRESSION: 1. Coronary calcium score 576 AU. This places the patient in the 83rd percentile for age and gender, suggesting high risk for future cardiac events.   2.  Mild nonobstructive CAD.   Dalton Mclean  Please make appointment for patient to be seen in the office to discuss clearance.

## 2024-04-16 ENCOUNTER — Ambulatory Visit (HOSPITAL_COMMUNITY)
Admission: RE | Admit: 2024-04-16 | Discharge: 2024-04-16 | Disposition: A | Discharge status: Home / Self Care | Source: Ambulatory Visit | Attending: Cardiovascular Disease | Admitting: Cardiovascular Disease

## 2024-04-16 DIAGNOSIS — I2510 Atherosclerotic heart disease of native coronary artery without angina pectoris: Secondary | ICD-10-CM | POA: Diagnosis not present

## 2024-04-16 LAB — ECHOCARDIOGRAM COMPLETE
Area-P 1/2: 2.73 cm2
S' Lateral: 3.4 cm

## 2024-04-23 ENCOUNTER — Ambulatory Visit: Admitting: Cardiology

## 2024-04-24 ENCOUNTER — Other Ambulatory Visit: Payer: Self-pay

## 2024-04-24 ENCOUNTER — Encounter (HOSPITAL_BASED_OUTPATIENT_CLINIC_OR_DEPARTMENT_OTHER): Payer: Self-pay | Admitting: Orthopaedic Surgery

## 2024-04-30 NOTE — H&P (Signed)
 "   PREOPERATIVE H&P  Chief Complaint: tear of medial meniscus of left knee  HPI: Jason Vaughn is a 69 y.o. male who is scheduled for, Procedures: ARTHROSCOPY, KNEE, WITH MEDIAL MENISCECTOMY.   A 69 year old male presents for evaluation of left knee pain. The patient reports that he began experiencing pain in his left knee after taking up golf, which involves significant torquing around the knee. He recalls a specific incident where he attempted to run a short distance, after which his knee became swollen and painful. The pain has persisted since then, with occasional swelling and a sensation of popping. The patient has a history of ACL reconstruction on the left knee in March 2023, performed by Dr. Shari, during which the meniscus was reportedly not torn.   Symptoms are rated as moderate to severe, and have been worsening.  This is significantly impairing activities of daily living.    Please see clinic note for further details on this patient's care.    He has elected for surgical management.   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   Coronary artery disease    Diverticulosis of colon    GERD (gastroesophageal reflux disease)    Hyperlipidemia    Prostatitis    Skin cancer    skin: basal & squamous cell   Tear of MCL (medial collateral ligament) of knee    [facility]   Past Surgical History:  Procedure Laterality Date   COLONOSCOPY  2005 & 2015   diverticulosis; Dr Debrah FONTANA HERNIA REPAIR Bilateral 11/10/2014   KNEE ARTHROSCOPY WITH ANTERIOR CRUCIATE LIGAMENT (ACL) REPAIR     2023   PROSTATE SURGERY  2020   SHOULDER SURGERY Bilateral 04/18/2014   UPPER GASTROINTESTINAL ENDOSCOPY     Social History   Socioeconomic History   Marital status: Single    Spouse name: Not on file   Number of children: 2   Years of education: Not on file   Highest  education level: Not on file  Occupational History   Occupation: sales  Tobacco Use   Smoking status: Former    Current packs/day: 0.00    Types: Cigarettes    Quit date: 04/18/1980    Years since quitting: 44.0   Smokeless tobacco: Never   Tobacco comments:    smoked ages [age] up to < 1 ppd  Vaping Use   Vaping status: Never Used  Substance and Sexual Activity   Alcohol  use: Yes    Alcohol /week: 7.0 standard drinks of alcohol     Types: 7 Standard drinks or equivalent per week    Comment: limited   Drug use: No   Sexual activity: Not on file  Other Topics Concern   Not on file  Social History Narrative   Not on file   Social Drivers of Health   Tobacco Use: Medium Risk (04/24/2024)   Patient History    Smoking Tobacco Use: Former    Smokeless Tobacco Use: Never    Passive Exposure: Not on Actuary Strain: Not on file  Food Insecurity: Not on file  Transportation Needs: Not on file  Physical Activity: Not on file  Stress: Not on file  Social Connections: Not on file  Depression (EYV7-0): Not on file  Alcohol  Screen: Not on file  Housing: Not on file  Utilities: Not on  file  Health Literacy: Not on file   Family History  Problem Relation Age of Onset   Coronary artery disease Mother        stent @ 62   Lung cancer Father    Eosinophilic granuloma Father    Hypertension Brother    Mental illness Brother        bipolar disorder comittied suicide   Breast cancer Maternal Grandmother    Heart attack Maternal Grandmother        early 6s   Alzheimer's disease Maternal Grandmother    Heart attack Maternal Grandfather 48       MI   Cancer Paternal Grandmother        LYMPHOMA   Heart attack Other 45       MGuncle   Diabetes Neg Hx    Stroke Neg Hx    Colon cancer Neg Hx    Esophageal cancer Neg Hx    Rectal cancer Neg Hx    Stomach cancer Neg Hx    Allergies[1] Prior to Admission medications  Medication Sig Start Date End Date Taking?  Authorizing Provider  aspirin  EC 81 MG tablet Take 1 tablet (81 mg total) by mouth daily. Swallow whole. 03/12/24  Yes Thukkani, Arun K, MD  atorvastatin (LIPITOR) 20 MG tablet Take 20 mg by mouth daily.   Yes [provider]  CIALIS 5 MG tablet TK 1 T PO QD 12/11/14  Yes [provider]  tamsulosin (FLOMAX) 0.4 MG CAPS capsule Take by mouth. 05/30/17  Yes [provider]  EPINEPHrine 0.3 mg/0.3 mL IJ SOAJ injection INJECT INTRAMUSCULARLY AS DIRECTED for 1    [provider]  fluticasone  (FLONASE ) 50 MCG/ACT nasal spray Place 2 sprays into both nostrils daily. Patient not taking: Reported on 04/15/2024 04/08/13   Antonio Cyndee Rockers R, DO  metoprolol  tartrate (LOPRESSOR ) 25 MG tablet Take 1 tablet (25 mg total) by mouth once for 1 dose. Take 90-120 minutes prior to scan. Hold for SBP less than 110. Patient not taking: Reported on 04/15/2024 03/12/24 03/12/24  Thukkani, Arun K, MD  Multiple Vitamins-Minerals (MULTIVITAL) tablet Take 1 tablet by mouth daily.   Patient not taking: Reported on 04/15/2024    [provider]  naproxen sodium (ALEVE) 220 MG tablet Take 220 mg by mouth as needed.    [provider]    ROS: All other systems have been reviewed and were otherwise negative with the exception of those mentioned in the HPI and as above.  Physical Exam: General: Alert, no acute distress Cardiovascular: No pedal edema Respiratory: No cyanosis, no use of accessory musculature GI: No organomegaly, abdomen is soft and non-tender Skin: No lesions in the area of chief complaint Neurologic: Sensation intact distally Psychiatric: Patient is competent for consent with normal mood and affect Lymphatic: No axillary or cervical lymphadenopathy  MUSCULOSKELETAL:  Tender palpation about the medial joint line, full range of motion, good end point with Lachman.   Imaging: MRI of the left knee shows a medial meniscus tear. Independent  interpretation performed by me confirms the presence of the tear, consistent with the patient's symptoms.  Assessment: tear of medial meniscus of left knee  Plan: Plan for Procedures: ARTHROSCOPY, KNEE, WITH MEDIAL MENISCECTOMY  The risks benefits and alternatives were discussed with the patient including but not limited to the risks of nonoperative treatment, versus surgical intervention including infection, bleeding, nerve injury,  blood clots, cardiopulmonary complications, morbidity, mortality, among others, and they were willing to proceed.  The patient acknowledged the explanation, agreed to proceed with the plan and consent was signed.   Operative Plan: LKS MM Discharge Medications: standard DVT Prophylaxis: aspirin  Physical Therapy: outpatient Special Discharge needs: +/-   Aleck LOISE Stalling, PA-C  04/30/2024 8:21 AM     [1]  Allergies Allergen Reactions   Bee Venom Anaphylaxis and Swelling   Other Other (See Comments)    Sneezing, Watery Eyes and Nose Dust mites & Cat dander  Sneezing, Watery Eyes and Nose, Dust mites   "

## 2024-05-01 ENCOUNTER — Ambulatory Visit (HOSPITAL_COMMUNITY)

## 2024-05-01 NOTE — Discharge Instructions (Addendum)
 Bonner Hair MD, MPH Aleck Stalling, PA-C [facility] [address] [phone] (tel)   [phone] (fax)   POST-OPERATIVE INSTRUCTIONS - Knee Arthroscopy  WOUND CARE - You may remove the Operative Dressing on Post-Op Day #3 (72hrs after surgery).   -  Alternatively if you would like you can leave dressing on until follow-up if within 7-8 days but keep it dry. - Leave steri-strips in place until they fall off on their own, usually 2 weeks postop. - An ACE wrap may be used to control swelling, do not wrap this too tight.  If the initial ACE wrap feels too tight you may loosen it. - There may be a small amount of fluid/bleeding leaking at the surgical site.  - This is normal; the knee is filled with fluid during the procedure and can leak for 24-48hrs after surgery. You may change/reinforce the bandage as needed.  - Use the Cryocuff or Ice as often as possible for the first 7 days, then as needed for pain relief. Always keep a towel, ACE wrap or other barrier between the cooling unit and your skin.  - You may shower on Post-Op Day #3. Gently pat the area dry.  - Do not soak the knee in water or submerge it.  - Do not go swimming in the pool or ocean until 4 weeks after surgery or when otherwise instructed.  Keep dry incisions as dry as possible.   BRACE/AMBULATION  -            You will not need a brace after this procedure.   - You may use crutches initially to help you weight bear, but this is not required - You can put full weight on your operative leg as you feel comfortable  PHYSICAL THERAPY - You will begin physical therapy soon after surgery (unless otherwise specified) - Please call to set up an appointment, if you do not already have one  - Let our office if there are any issues with scheduling your therapy    REGIONAL ANESTHESIA (NERVE BLOCKS) The anesthesia team may have performed a nerve block for you this is a great tool used to  minimize pain.   The block may start wearing off overnight (between 8-24 hours postop) When the block wears off, your pain may go from nearly zero to the pain you would have had postop without the block. This is an abrupt transition but nothing dangerous is happening.   This can be a challenging period but utilize your as needed pain medications to try and manage this period. We suggest you use the pain medication the first night prior to going to bed, to ease this transition.  You may take an extra dose of narcotic when this happens if needed   POST-OP MEDICATIONS- Multimodal approach to pain control In general your pain will be controlled with a combination of substances.  Prescriptions unless otherwise discussed are electronically sent to your pharmacy.  This is a carefully made plan we use to minimize narcotic use.     Meloxicam  - Anti-inflammatory medication taken on a scheduled basis Acetaminophen  - Non-narcotic pain medicine taken on a scheduled basis  Tramadol  - This is a strong narcotic, to be used only on an as needed basis for SEVERE pain. Aspirin  81mg  - This medicine is used to minimize the risk of blood clots after surgery. Zofran  - take as needed for nausea    FOLLOW-UP   Please call the office to  schedule a follow-up appointment for your incision check, 7-10 days post-operatively.   IF YOU HAVE ANY QUESTIONS, PLEASE FEEL FREE TO CALL OUR OFFICE.   HELPFUL INFORMATION   Keep your leg elevated to decrease swelling, which will then in turn decrease your pain. I would elevate the foot of your bed by putting a couple of couch pillows between your mattress and box spring. I would not keep pillow directly under your ankle.  - Do not sleep with a pillow behind your knee even if it is more comfortable as this may make it harder to get your knee fully straight long term.   There will be MORE swelling on days 1-3 than there is on the day of surgery.  This also is normal. The  swelling will decrease with the anti-inflammatory medication, ice and keeping it elevated. The swelling will make it more difficult to bend your knee. As the swelling goes down your motion will become easier   You may develop swelling and bruising that extends from your knee down to your calf and perhaps even to your foot over the next week. Do not be alarmed. This too is normal, and it is due to gravity   There may be some numbness adjacent to the incision site. This may last for 6-12 months or longer in some patients and is expected.   You may return to sedentary work/school in the next couple of days when you feel up to it. You will need to keep your leg elevated as much as possible    You should wean off your narcotic medicines as soon as you are able.  Most patients will be off narcotics before their first postop appointment.    We suggest you use the pain medication the first night prior to going to bed, in order to ease any pain when the anesthesia wears off. You should avoid taking pain medications on an empty stomach as it will make you nauseous.   Do not drink alcoholic beverages or take illicit drugs when taking pain medications.   It is against the law to drive while taking narcotics. You cannot drive if your Right leg is in brace locked in extension.   Pain medication may make you constipated.  Below are a few solutions to try in this order:  o Decrease the amount of pain medication if you aren't having pain.  o Drink lots of decaffeinated fluids.  o Drink prune juice and/or eat dried prunes   o If the first 3 don't work start with additional solutions  o Take Colace - an over-the-counter stool softener  o Take Senokot - an over-the-counter laxative  o Take Miralax - a stronger over-the-counter laxative    For more information including helpful videos and documents visit our website:   [email or web address]-[email or web address]    Post Anesthesia Home  Care Instructions  Activity: Get plenty of rest for the remainder of the day. A responsible individual must stay with you for 24 hours following the procedure.  For the next 24 hours, DO NOT: -Drive a car -Advertising copywriter -Drink alcoholic beverages -Take any medication unless instructed by your physician -Make any legal decisions or sign important papers.  Meals: Start with liquid foods such as gelatin or soup. Progress to regular foods as tolerated. Avoid greasy, spicy, heavy foods. If nausea and/or vomiting occur, drink only clear liquids until the nausea and/or vomiting subsides. Call your physician if vomiting continues.  Special Instructions/Symptoms: Your throat may feel dry or sore  from the anesthesia or the breathing tube placed in your throat during surgery. If this causes discomfort, gargle with warm salt water. The discomfort should disappear within 24 hours.  If you had a scopolamine patch placed behind your ear for the management of post- operative nausea and/or vomiting:  1. The medication in the patch is effective for 72 hours, after which it should be removed.  Wrap patch in a tissue and discard in the trash. Wash hands thoroughly with soap and water. 2. You may remove the patch earlier than 72 hours if you experience unpleasant side effects which may include dry mouth, dizziness or visual disturbances. 3. Avoid touching the patch. Wash your hands with soap and water after contact with the patch.    No Tylenol  until after 12:30pm today if needed No NSAIDs until after 1:45pm today if needed

## 2024-05-02 ENCOUNTER — Ambulatory Visit (HOSPITAL_BASED_OUTPATIENT_CLINIC_OR_DEPARTMENT_OTHER): Admitting: Anesthesiology

## 2024-05-02 ENCOUNTER — Ambulatory Visit (HOSPITAL_BASED_OUTPATIENT_CLINIC_OR_DEPARTMENT_OTHER)
Admission: RE | Admit: 2024-05-02 | Discharge: 2024-05-02 | Disposition: A | Discharge status: Home / Self Care | Attending: Orthopaedic Surgery | Admitting: Orthopaedic Surgery

## 2024-05-02 ENCOUNTER — Encounter (HOSPITAL_BASED_OUTPATIENT_CLINIC_OR_DEPARTMENT_OTHER): Payer: Self-pay | Admitting: Orthopaedic Surgery

## 2024-05-02 ENCOUNTER — Other Ambulatory Visit: Payer: Self-pay

## 2024-05-02 ENCOUNTER — Encounter (HOSPITAL_BASED_OUTPATIENT_CLINIC_OR_DEPARTMENT_OTHER): Admission: RE | Disposition: A | Payer: Self-pay | Source: Home / Self Care | Attending: Orthopaedic Surgery

## 2024-05-02 DIAGNOSIS — S83242A Other tear of medial meniscus, current injury, left knee, initial encounter: Secondary | ICD-10-CM | POA: Diagnosis not present

## 2024-05-02 DIAGNOSIS — M1990 Unspecified osteoarthritis, unspecified site: Secondary | ICD-10-CM | POA: Diagnosis not present

## 2024-05-02 DIAGNOSIS — X500XXA Overexertion from strenuous movement or load, initial encounter: Secondary | ICD-10-CM | POA: Diagnosis not present

## 2024-05-02 DIAGNOSIS — K219 Gastro-esophageal reflux disease without esophagitis: Secondary | ICD-10-CM | POA: Insufficient documentation

## 2024-05-02 DIAGNOSIS — Z87891 Personal history of nicotine dependence: Secondary | ICD-10-CM | POA: Diagnosis not present

## 2024-05-02 DIAGNOSIS — S83232A Complex tear of medial meniscus, current injury, left knee, initial encounter: Secondary | ICD-10-CM | POA: Diagnosis present

## 2024-05-02 HISTORY — DX: Atherosclerotic heart disease of native coronary artery without angina pectoris: I25.10

## 2024-05-02 HISTORY — PX: KNEE ARTHROSCOPY WITH MEDIAL MENISECTOMY: SHX5651

## 2024-05-02 MED ORDER — FENTANYL CITRATE (PF) 100 MCG/2ML IJ SOLN
INTRAMUSCULAR | Status: DC | PRN
  Administered 2024-05-02: 50 ug via INTRAVENOUS

## 2024-05-02 MED ORDER — KETOROLAC TROMETHAMINE 30 MG/ML IJ SOLN
INTRAMUSCULAR | Status: DC | PRN
  Administered 2024-05-02: 30 mg via INTRAVENOUS

## 2024-05-02 MED ORDER — LACTATED RINGERS IV SOLN: INTRAVENOUS | Status: DC

## 2024-05-02 MED ORDER — BUPIVACAINE HCL (PF) 0.25 % IJ SOLN
INTRAMUSCULAR | Status: AC
  Filled 2024-05-02: qty 30

## 2024-05-02 MED ORDER — CEFAZOLIN SODIUM-DEXTROSE 2-4 GM/100ML-% IV SOLN
2.0000 g | INTRAVENOUS | Status: AC
  Administered 2024-05-02: 2 g via INTRAVENOUS

## 2024-05-02 MED ORDER — ONDANSETRON HCL 4 MG/2ML IJ SOLN
INTRAMUSCULAR | Status: DC | PRN
  Administered 2024-05-02: 4 mg via INTRAVENOUS

## 2024-05-02 MED ORDER — BUPIVACAINE HCL (PF) 0.25 % IJ SOLN
INTRAMUSCULAR | Status: DC | PRN
  Administered 2024-05-02: 20 mL

## 2024-05-02 MED ORDER — MIDAZOLAM HCL 5 MG/5ML IJ SOLN
INTRAMUSCULAR | Status: DC | PRN
  Administered 2024-05-02: 2 mg via INTRAVENOUS

## 2024-05-02 MED ORDER — ASPIRIN 81 MG PO TBEC: 81.0000 mg | DELAYED_RELEASE_TABLET | Freq: Two times a day (BID) | ORAL | 0 refills | Status: AC

## 2024-05-02 MED ORDER — ACETAMINOPHEN 500 MG PO TABS: 1000.0000 mg | ORAL_TABLET | Freq: Three times a day (TID) | ORAL | 0 refills | Status: AC

## 2024-05-02 MED ORDER — GABAPENTIN 300 MG PO CAPS
ORAL_CAPSULE | ORAL | Status: AC
  Filled 2024-05-02: qty 1

## 2024-05-02 MED ORDER — TRAMADOL HCL 50 MG PO TABS: 50.0000 mg | ORAL_TABLET | Freq: Four times a day (QID) | ORAL | 0 refills | Status: AC | PRN

## 2024-05-02 MED ORDER — DEXAMETHASONE SOD PHOSPHATE PF 10 MG/ML IJ SOLN
INTRAMUSCULAR | Status: DC | PRN
  Administered 2024-05-02: 10 mg via INTRAVENOUS

## 2024-05-02 MED ORDER — SODIUM CHLORIDE 0.9 % IR SOLN
Status: DC | PRN
  Administered 2024-05-02: 3000 mL

## 2024-05-02 MED ORDER — MELOXICAM 15 MG PO TABS: 15.0000 mg | ORAL_TABLET | Freq: Every day | ORAL | 0 refills | Status: AC

## 2024-05-02 MED ORDER — FENTANYL CITRATE (PF) 100 MCG/2ML IJ SOLN
INTRAMUSCULAR | Status: AC
  Filled 2024-05-02: qty 2

## 2024-05-02 MED ORDER — PROPOFOL 10 MG/ML IV BOLUS
INTRAVENOUS | Status: DC | PRN
  Administered 2024-05-02: 150 mg via INTRAVENOUS

## 2024-05-02 MED ORDER — VANCOMYCIN HCL 1000 MG IV SOLR
INTRAVENOUS | Status: AC
  Filled 2024-05-02: qty 20

## 2024-05-02 MED ORDER — LIDOCAINE 2% (20 MG/ML) 5 ML SYRINGE
INTRAMUSCULAR | Status: DC | PRN
  Administered 2024-05-02: 60 mg via INTRAVENOUS

## 2024-05-02 MED ORDER — ACETAMINOPHEN 500 MG PO TABS
ORAL_TABLET | ORAL | Status: AC
  Filled 2024-05-02: qty 2

## 2024-05-02 MED ORDER — PROPOFOL 10 MG/ML IV BOLUS
INTRAVENOUS | Status: AC
  Filled 2024-05-02: qty 20

## 2024-05-02 MED ORDER — HYDROMORPHONE HCL 1 MG/ML IJ SOLN: 0.2500 mg | INTRAMUSCULAR | Status: DC | PRN

## 2024-05-02 MED ORDER — KETOROLAC TROMETHAMINE 30 MG/ML IJ SOLN
INTRAMUSCULAR | Status: AC
  Filled 2024-05-02: qty 1

## 2024-05-02 MED ORDER — ONDANSETRON HCL 4 MG/2ML IJ SOLN
INTRAMUSCULAR | Status: AC
  Filled 2024-05-02: qty 2

## 2024-05-02 MED ORDER — CEFAZOLIN SODIUM-DEXTROSE 2-4 GM/100ML-% IV SOLN
INTRAVENOUS | Status: AC
  Filled 2024-05-02: qty 100

## 2024-05-02 MED ORDER — ONDANSETRON 4 MG PO TBDP: 4.0000 mg | ORAL_TABLET | Freq: Three times a day (TID) | ORAL | 0 refills | Status: AC | PRN

## 2024-05-02 MED ORDER — GABAPENTIN 300 MG PO CAPS
300.0000 mg | ORAL_CAPSULE | Freq: Once | ORAL | Status: AC
  Administered 2024-05-02: 300 mg via ORAL

## 2024-05-02 MED ORDER — MIDAZOLAM HCL 2 MG/2ML IJ SOLN
INTRAMUSCULAR | Status: AC
  Filled 2024-05-02: qty 2

## 2024-05-02 MED ORDER — ACETAMINOPHEN 500 MG PO TABS
1000.0000 mg | ORAL_TABLET | Freq: Once | ORAL | Status: AC
  Administered 2024-05-02: 1000 mg via ORAL

## 2024-05-02 NOTE — Anesthesia Preprocedure Evaluation (Addendum)
"                                    Anesthesia Evaluation  Patient identified by MRN, date of birth, ID band Patient awake    Reviewed: Allergy & Precautions, H&P , NPO status , Patient's Chart, lab work & pertinent test results  Airway Mallampati: II  TM Distance: >3 FB Neck ROM: Full    Dental no notable dental hx. (+) Teeth Intact, Dental Advisory Given   Pulmonary former smoker   Pulmonary exam normal breath sounds clear to auscultation       Cardiovascular negative cardio ROS  Rhythm:Regular Rate:Normal     Neuro/Psych negative neurological ROS  negative psych ROS   GI/Hepatic Neg liver ROS,GERD  ,,  Endo/Other  negative endocrine ROS    Renal/GU negative Renal ROS  negative genitourinary   Musculoskeletal  (+) Arthritis , Osteoarthritis,    Abdominal   Peds  Hematology negative hematology ROS (+)   Anesthesia Other Findings   Reproductive/Obstetrics negative OB ROS                              Anesthesia Physical Anesthesia Plan  ASA: 2  Anesthesia Plan: General   Post-op Pain Management: Tylenol  PO (pre-op)* and Toradol  IV (intra-op)*   Induction: Intravenous  PONV Risk Score and Plan: 3 and Ondansetron , Dexamethasone  and Midazolam   Airway Management Planned: LMA  Additional Equipment:   Intra-op Plan:   Post-operative Plan: Extubation in OR  Informed Consent: I have reviewed the patients History and Physical, chart, labs and discussed the procedure including the risks, benefits and alternatives for the proposed anesthesia with the patient or authorized representative who has indicated his/her understanding and acceptance.     Dental advisory given  Plan Discussed with: CRNA  Anesthesia Plan Comments:          Anesthesia Quick Evaluation  "

## 2024-05-02 NOTE — Interval H&P Note (Signed)
 All questions answered, patient wants to proceed with procedure. ? ?

## 2024-05-02 NOTE — Transfer of Care (Signed)
 Immediate Anesthesia Transfer of Care Note  Patient: Jason Vaughn  Procedure(s) Performed: ARTHROSCOPY, KNEE, WITH MEDIAL MENISCECTOMY (Left: Knee)  Patient Location: PACU  Anesthesia Type:General  Level of Consciousness: drowsy, patient cooperative, and responds to stimulation  Airway & Oxygen Therapy: Patient Spontanous Breathing and Patient connected to face mask oxygen  Post-op Assessment: Report given to RN and Post -op Vital signs reviewed and stable  Post vital signs: Reviewed and stable  Last Vitals:  Vitals Value Taken Time  BP 115/86 05/02/24 08:01  Temp    Pulse 62 05/02/24 08:02  Resp 14 05/02/24 08:02  SpO2 97 % 05/02/24 08:02  Vitals shown include unfiled device data.  Last Pain:  Vitals:   05/02/24 0628  TempSrc: Temporal  PainSc: 0-No pain      Patients Stated Pain Goal: 3 (05/02/24 9371)  Complications: No notable events documented.

## 2024-05-02 NOTE — Anesthesia Procedure Notes (Signed)
 Procedure Name: LMA Insertion Date/Time: 05/02/2024 7:24 AM  Performed by: Frost Kayla MATSU, CRNAPre-anesthesia Checklist: Patient identified, Emergency Drugs available, Suction available and Patient being monitored Patient Re-evaluated:Patient Re-evaluated prior to induction Oxygen Delivery Method: Circle system utilized Preoxygenation: Pre-oxygenation with 100% oxygen Induction Type: IV induction LMA: LMA inserted LMA Size: 4.0 Number of attempts: 1 Placement Confirmation: positive ETCO2 Tube secured with: Tape Dental Injury: Teeth and Oropharynx as per pre-operative assessment

## 2024-05-02 NOTE — Anesthesia Postprocedure Evaluation (Signed)
"   Anesthesia Post Note  Patient: Jason Vaughn  Procedure(s) Performed: ARTHROSCOPY, KNEE, WITH MEDIAL MENISCECTOMY (Left: Knee)     Patient location during evaluation: PACU Anesthesia Type: General Level of consciousness: awake and alert Pain management: pain level controlled Vital Signs Assessment: post-procedure vital signs reviewed and stable Respiratory status: spontaneous breathing, nonlabored ventilation and respiratory function stable Cardiovascular status: blood pressure returned to baseline and stable Postop Assessment: no apparent nausea or vomiting Anesthetic complications: no   No notable events documented.  Last Vitals:  Vitals:   05/02/24 0815 05/02/24 0830  BP: 120/83 117/80  Pulse: (!) 54 (!) 55  Resp: 10 12  Temp:    SpO2: 95% 95%    Last Pain:  Vitals:   05/02/24 0906  TempSrc:   PainSc: 0-No pain                 Gracelynn Bircher,W. EDMOND      "

## 2024-05-02 NOTE — Op Note (Signed)
 Orthopaedic Surgery Operative Note (CSN: [id number])  Jason Vaughn  03-31-1956 Date of Surgery: 05/02/2024   Diagnoses:  tear of medial meniscus of left knee  Procedure: Left medial partial meniscectomy 29881   Operative Finding Patient had full motion no limitation no instability.  Previous ACL reconstruction had a relatively vertical anterior femoral tunnel placement however otherwise appeared to be very healthy.  Medial meniscus tear was noted in the mid body about 10% total meniscal volume resected.  Root was intact.  Mild grade 2 changes on the lateral aspect of the medial femoral condyle.  Patellofemoral compartment had grade 1 softening throughout, lateral compartment was normal with the exception of grade 1 softening.  Successful completion of the planned procedure.    Post-operative plan: The patient will be WBAT.  The patient will be discharged home.  DVT prophylaxis Aspirin  81 mg twice daily for 6 weeks.  Pain control with PRN pain medication preferring oral medicines.  Follow up plan will be scheduled in approximately 7 days for incision check.  Post-Op Diagnosis: Same Surgeons:Primary: Cristy Bonner DASEN, MD Assistants:Caroline McBane, PA-C Location: [facility] OR ROOM 1 Anesthesia: General with local Antibiotics: Ancef  2 g Tourniquet time: none Estimated Blood Loss: Minimal Complications: None Specimens: None Implants: * No implants in log *  Indications for Surgery:   [name] is a 70 y.o. male with meniscus tear and mechanical symptoms.  Benefits and risks of operative and nonoperative management were discussed prior to surgery with patient/guardian(s) and informed consent form was completed.  Specific risks including infection, need for additional surgery, postmeniscectomy syndrome, continued arthrosis and pain amongst others.   Procedure:   The patient was identified properly. Informed consent was obtained and the surgical site was marked. The patient was taken up  to suite where general anesthesia was induced. The patient was placed in the supine position with a post against the surgical leg and a nonsterile tourniquet applied. The surgical leg was then prepped and draped usual sterile fashion.  A standard surgical timeout was performed.  2 standard anterior portals were made and diagnostic arthroscopy performed. Please note the findings as noted above.  We used a shaver to perform a synovectomy of the anterior medial and anterolateral compartments as well as overgrowth along the patella.  We performed a medial meniscectomy using a shaver and basket back to a stable base removing all loose fragments.  Incisions closed with absorbable suture. The patient was awoken from general anesthesia and taken to the PACU in stable condition without complication.   Aleck Stalling, PA-C, present and scrubbed throughout the case, critical for completion in a timely fashion, and for retraction, instrumentation, closure.

## 2024-05-03 ENCOUNTER — Encounter (HOSPITAL_BASED_OUTPATIENT_CLINIC_OR_DEPARTMENT_OTHER): Payer: Self-pay | Admitting: Orthopaedic Surgery

## 2024-10-15 ENCOUNTER — Ambulatory Visit: Admitting: Medical
